# Patient Record
Sex: Male | Born: 1950 | Race: White | Hispanic: No | Marital: Married | Smoking: Former smoker
Health system: Southern US, Community
[De-identification: ages and names within clinical notes are randomized; demographics above are authoritative.]

## PROBLEM LIST (undated history)

## (undated) DIAGNOSIS — R200 Anesthesia of skin: Secondary | ICD-10-CM

## (undated) DIAGNOSIS — M1990 Unspecified osteoarthritis, unspecified site: Secondary | ICD-10-CM

## (undated) DIAGNOSIS — K219 Gastro-esophageal reflux disease without esophagitis: Secondary | ICD-10-CM

## (undated) DIAGNOSIS — R112 Nausea with vomiting, unspecified: Secondary | ICD-10-CM

## (undated) DIAGNOSIS — I10 Essential (primary) hypertension: Secondary | ICD-10-CM

## (undated) DIAGNOSIS — Z9889 Other specified postprocedural states: Secondary | ICD-10-CM

## (undated) HISTORY — PX: LYMPH GLAND EXCISION: SHX13

## (undated) HISTORY — PX: EYE SURGERY: SHX253

## (undated) HISTORY — PX: VASECTOMY: SHX75

## (undated) HISTORY — PX: BACK SURGERY: SHX140

## (undated) HISTORY — PX: FRACTURE SURGERY: SHX138

## (undated) HISTORY — PX: OTHER SURGICAL HISTORY: SHX169

---

## 2014-04-25 ENCOUNTER — Other Ambulatory Visit: Payer: Self-pay | Admitting: Neurosurgery

## 2014-07-30 ENCOUNTER — Other Ambulatory Visit: Payer: Self-pay | Admitting: Neurosurgery

## 2014-07-30 NOTE — Pre-Procedure Instructions (Signed)
Jonathon Gonzales  07/30/2014   Your procedure is scheduled on:  Tuesday, August 07, 2014 at 7:30 AM.   Report to [facility] Entrance "A" Admitting Office at 5:30 AM.   Call this number if you have problems the morning of surgery: [phone]                Any questions prior to day of surgery, please call [phone] between the hours of 8 AM and 4 PM.   Remember:   Do not eat food or drink liquids after midnight.   Take these medicines the morning of surgery with A SIP OF WATER: Prilosec, Tylenol - if needed  Stop Mobic and Vitamins as of Wednesday, 08/01/14.   Do not wear jewelry.  Do not wear lotions, powders, or cologne. You may wear deodorant.  Men may shave face and neck.  Do not bring valuables to the hospital.  [facility] is not responsible                  for any belongings or valuables.               Contacts, dentures or bridgework may not be worn into surgery.  Leave suitcase in the car. After surgery it may be brought to your room.  For patients admitted to the hospital, discharge time is determined by your                treatment team.              Special Instructions: See "Preparing for Surgery"   Please read over the following fact sheets that you were given: Pain Booklet, Coughing and Deep Breathing, MRSA Information and Surgical Site Infection Prevention

## 2014-07-30 NOTE — Progress Notes (Signed)
No pre-op orders in EPIC. Called Dr. Melven Sartorius office and spoke with Janett Billow requesting orders.

## 2014-07-31 ENCOUNTER — Encounter (HOSPITAL_COMMUNITY)
Admission: RE | Admit: 2014-07-31 | Discharge: 2014-07-31 | Disposition: A | Discharge status: Home / Self Care | Payer: BC Managed Care – PPO | Source: Ambulatory Visit | Attending: Neurosurgery | Admitting: Neurosurgery

## 2014-07-31 ENCOUNTER — Encounter (HOSPITAL_COMMUNITY): Payer: Self-pay

## 2014-07-31 DIAGNOSIS — Z01812 Encounter for preprocedural laboratory examination: Secondary | ICD-10-CM | POA: Diagnosis present

## 2014-07-31 HISTORY — DX: Unspecified osteoarthritis, unspecified site: M19.90

## 2014-07-31 LAB — CBC
HCT: 46.4 % (ref 39.0–52.0)
Hemoglobin: 15.5 g/dL (ref 13.0–17.0)
MCH: 30.2 pg (ref 26.0–34.0)
MCHC: 33.4 g/dL (ref 30.0–36.0)
MCV: 90.4 fL (ref 78.0–100.0)
PLATELETS: 152 10*3/uL (ref 150–400)
RBC: 5.13 MIL/uL (ref 4.22–5.81)
RDW: 12.7 % (ref 11.5–15.5)
WBC: 5 10*3/uL (ref 4.0–10.5)

## 2014-07-31 LAB — SURGICAL PCR SCREEN
MRSA, PCR: NEGATIVE
STAPHYLOCOCCUS AUREUS: NEGATIVE

## 2014-07-31 LAB — BASIC METABOLIC PANEL
ANION GAP: 11 (ref 5–15)
BUN: 19 mg/dL (ref 6–23)
CALCIUM: 9.4 mg/dL (ref 8.4–10.5)
CO2: 25 mEq/L (ref 19–32)
Chloride: 104 mEq/L (ref 96–112)
Creatinine, Ser: 0.91 mg/dL (ref 0.50–1.35)
GFR calc Af Amer: >90 mL/min (ref >90)
GFR, EST NON AFRICAN AMERICAN: 88 mL/min — AB (ref >90)
Glucose, Bld: 93 mg/dL (ref 70–99)
POTASSIUM: 4.9 meq/L (ref 3.7–5.3)
Sodium: 140 mEq/L (ref 137–147)

## 2014-08-06 MED ORDER — CEFAZOLIN SODIUM-DEXTROSE 2-3 GM-% IV SOLR
2.0000 g | INTRAVENOUS | Status: AC
  Administered 2014-08-07: 2 g via INTRAVENOUS
  Filled 2014-08-06: qty 50

## 2014-08-06 NOTE — Anesthesia Preprocedure Evaluation (Addendum)
Anesthesia Evaluation  Patient identified by MRN, date of birth, ID band Patient awake    Reviewed: Allergy & Precautions, H&P , NPO status , Patient's Chart, lab work & pertinent test results, reviewed documented beta blocker date and time   Airway Mallampati: II   Neck ROM: Full    Dental  (+) Teeth Intact, Dental Advisory Given, Partial Upper   Pulmonary former smoker,  breath sounds clear to auscultation        Cardiovascular Rhythm:Regular     Neuro/Psych    GI/Hepatic   Endo/Other    Renal/GU      Musculoskeletal   Abdominal (+)  Abdomen: soft.    Peds  Hematology   Anesthesia Other Findings   Reproductive/Obstetrics                           Anesthesia Physical Anesthesia Plan  ASA: II  Anesthesia Plan: General   Post-op Pain Management:    Induction: Intravenous  Airway Management Planned: Oral ETT  Additional Equipment:   Intra-op Plan:   Post-operative Plan:   Informed Consent: I have reviewed the patients History and Physical, chart, labs and discussed the procedure including the risks, benefits and alternatives for the proposed anesthesia with the patient or authorized representative who has indicated his/her understanding and acceptance.   Dental advisory given  Plan Discussed with: CRNA, Anesthesiologist and Surgeon  Anesthesia Plan Comments:        Anesthesia Quick Evaluation

## 2014-08-07 ENCOUNTER — Encounter (HOSPITAL_COMMUNITY)
Admission: RE | Disposition: A | Discharge status: Home / Self Care | Payer: Self-pay | Source: Ambulatory Visit | Attending: Neurosurgery

## 2014-08-07 ENCOUNTER — Inpatient Hospital Stay (HOSPITAL_COMMUNITY): Payer: BC Managed Care – PPO

## 2014-08-07 ENCOUNTER — Inpatient Hospital Stay (HOSPITAL_COMMUNITY)
Admission: RE | Admit: 2014-08-07 | Discharge: 2014-08-09 | DRG: 459 | Disposition: A | Discharge status: Home / Self Care | Payer: BC Managed Care – PPO | Source: Ambulatory Visit | Attending: Neurosurgery | Admitting: Neurosurgery

## 2014-08-07 ENCOUNTER — Inpatient Hospital Stay (HOSPITAL_COMMUNITY): Payer: BC Managed Care – PPO | Admitting: Anesthesiology

## 2014-08-07 ENCOUNTER — Encounter (HOSPITAL_COMMUNITY): Payer: Self-pay | Admitting: Anesthesiology

## 2014-08-07 DIAGNOSIS — M5386 Other specified dorsopathies, lumbar region: Secondary | ICD-10-CM | POA: Diagnosis present

## 2014-08-07 DIAGNOSIS — M5116 Intervertebral disc disorders with radiculopathy, lumbar region: Secondary | ICD-10-CM | POA: Diagnosis present

## 2014-08-07 DIAGNOSIS — M25551 Pain in right hip: Secondary | ICD-10-CM | POA: Diagnosis present

## 2014-08-07 DIAGNOSIS — G5600 Carpal tunnel syndrome, unspecified upper limb: Secondary | ICD-10-CM | POA: Diagnosis present

## 2014-08-07 DIAGNOSIS — M1990 Unspecified osteoarthritis, unspecified site: Secondary | ICD-10-CM | POA: Diagnosis present

## 2014-08-07 DIAGNOSIS — M4316 Spondylolisthesis, lumbar region: Principal | ICD-10-CM | POA: Diagnosis present

## 2014-08-07 DIAGNOSIS — M4806 Spinal stenosis, lumbar region: Secondary | ICD-10-CM | POA: Diagnosis present

## 2014-08-07 DIAGNOSIS — M7130 Other bursal cyst, unspecified site: Secondary | ICD-10-CM | POA: Diagnosis present

## 2014-08-07 DIAGNOSIS — G9519 Other vascular myelopathies: Secondary | ICD-10-CM | POA: Diagnosis present

## 2014-08-07 DIAGNOSIS — Z419 Encounter for procedure for purposes other than remedying health state, unspecified: Secondary | ICD-10-CM

## 2014-08-07 DIAGNOSIS — M7138 Other bursal cyst, other site: Secondary | ICD-10-CM | POA: Diagnosis present

## 2014-08-07 DIAGNOSIS — M549 Dorsalgia, unspecified: Secondary | ICD-10-CM | POA: Diagnosis present

## 2014-08-07 HISTORY — PX: MAXIMUM ACCESS (MAS)POSTERIOR LUMBAR INTERBODY FUSION (PLIF) 1 LEVEL: SHX6368

## 2014-08-07 SURGERY — FOR MAXIMUM ACCESS (MAS) POSTERIOR LUMBAR INTERBODY FUSION (PLIF) 1 LEVEL
Anesthesia: General | Site: Spine Lumbar

## 2014-08-07 MED ORDER — METHOCARBAMOL 500 MG PO TABS
500.0000 mg | ORAL_TABLET | Freq: Four times a day (QID) | ORAL | Status: DC | PRN
  Administered 2014-08-07 – 2014-08-09 (×6): 500 mg via ORAL
  Filled 2014-08-07 (×6): qty 1

## 2014-08-07 MED ORDER — PHENYLEPHRINE HCL 10 MG/ML IJ SOLN
INTRAMUSCULAR | Status: DC | PRN
  Administered 2014-08-07: 40 ug via INTRAVENOUS
  Administered 2014-08-07 (×2): 80 ug via INTRAVENOUS

## 2014-08-07 MED ORDER — METHOCARBAMOL 500 MG PO TABS
ORAL_TABLET | ORAL | Status: AC
  Filled 2014-08-07: qty 1

## 2014-08-07 MED ORDER — FENTANYL CITRATE 0.05 MG/ML IJ SOLN
INTRAMUSCULAR | Status: AC
  Filled 2014-08-07: qty 2

## 2014-08-07 MED ORDER — PROPOFOL INFUSION 10 MG/ML OPTIME
INTRAVENOUS | Status: DC | PRN
  Administered 2014-08-07: 50 ug/kg/min via INTRAVENOUS

## 2014-08-07 MED ORDER — MORPHINE SULFATE 2 MG/ML IJ SOLN
1.0000 mg | INTRAMUSCULAR | Status: DC | PRN
  Administered 2014-08-07 (×2): 2 mg via INTRAVENOUS
  Filled 2014-08-07 (×2): qty 1

## 2014-08-07 MED ORDER — FLEET ENEMA 7-19 GM/118ML RE ENEM
1.0000 | ENEMA | Freq: Once | RECTAL | Status: AC | PRN
Start: 2014-08-07 — End: 2014-08-07
  Filled 2014-08-07: qty 1

## 2014-08-07 MED ORDER — PROPOFOL 10 MG/ML IV EMUL
INTRAVENOUS | Status: AC
  Filled 2014-08-07: qty 150

## 2014-08-07 MED ORDER — MEPERIDINE HCL 25 MG/ML IJ SOLN: 6.2500 mg | INTRAMUSCULAR | Status: DC | PRN

## 2014-08-07 MED ORDER — CEFAZOLIN SODIUM 1-5 GM-% IV SOLN
1.0000 g | Freq: Three times a day (TID) | INTRAVENOUS | Status: AC
  Administered 2014-08-07 (×2): 1 g via INTRAVENOUS
  Filled 2014-08-07 (×2): qty 50

## 2014-08-07 MED ORDER — METHOCARBAMOL 1000 MG/10ML IJ SOLN
500.0000 mg | Freq: Four times a day (QID) | INTRAVENOUS | Status: DC | PRN
  Filled 2014-08-07: qty 5

## 2014-08-07 MED ORDER — SODIUM CHLORIDE 0.9 % IJ SOLN: 3.0000 mL | INTRAMUSCULAR | Status: DC | PRN

## 2014-08-07 MED ORDER — FENTANYL CITRATE 0.05 MG/ML IJ SOLN
INTRAMUSCULAR | Status: AC
  Filled 2014-08-07: qty 5

## 2014-08-07 MED ORDER — LACTATED RINGERS IV SOLN
INTRAVENOUS | Status: DC | PRN
  Administered 2014-08-07 (×3): via INTRAVENOUS

## 2014-08-07 MED ORDER — PROPOFOL 10 MG/ML IV EMUL
INTRAVENOUS | Status: AC
  Filled 2014-08-07: qty 100

## 2014-08-07 MED ORDER — ONDANSETRON HCL 4 MG/2ML IJ SOLN
INTRAMUSCULAR | Status: DC | PRN
  Administered 2014-08-07: 4 mg via INTRAVENOUS

## 2014-08-07 MED ORDER — DOCUSATE SODIUM 100 MG PO CAPS
100.0000 mg | ORAL_CAPSULE | Freq: Two times a day (BID) | ORAL | Status: DC
  Administered 2014-08-07 – 2014-08-08 (×3): 100 mg via ORAL
  Filled 2014-08-07 (×5): qty 1

## 2014-08-07 MED ORDER — ONDANSETRON HCL 4 MG/2ML IJ SOLN
INTRAMUSCULAR | Status: AC
  Filled 2014-08-07: qty 2

## 2014-08-07 MED ORDER — DEXAMETHASONE SODIUM PHOSPHATE 4 MG/ML IJ SOLN
INTRAMUSCULAR | Status: DC | PRN
  Administered 2014-08-07: 4 mg via INTRAVENOUS

## 2014-08-07 MED ORDER — SUCCINYLCHOLINE CHLORIDE 20 MG/ML IJ SOLN
INTRAMUSCULAR | Status: AC
  Filled 2014-08-07: qty 1

## 2014-08-07 MED ORDER — KCL IN DEXTROSE-NACL 20-5-0.45 MEQ/L-%-% IV SOLN
INTRAVENOUS | Status: DC
  Filled 2014-08-07 (×5): qty 1000

## 2014-08-07 MED ORDER — PROPOFOL 10 MG/ML IV BOLUS
INTRAVENOUS | Status: DC | PRN
  Administered 2014-08-07: 150 mg via INTRAVENOUS

## 2014-08-07 MED ORDER — THROMBIN 20000 UNITS EX SOLR
CUTANEOUS | Status: DC | PRN
  Administered 2014-08-07: 09:00:00 via TOPICAL

## 2014-08-07 MED ORDER — BUPIVACAINE LIPOSOME 1.3 % IJ SUSP
20.0000 mL | Freq: Once | INTRAMUSCULAR | Status: DC
  Filled 2014-08-07: qty 20

## 2014-08-07 MED ORDER — HYDROCODONE-ACETAMINOPHEN 5-325 MG PO TABS: 1.0000 | ORAL_TABLET | ORAL | Status: DC | PRN

## 2014-08-07 MED ORDER — 0.9 % SODIUM CHLORIDE (POUR BTL) OPTIME
TOPICAL | Status: DC | PRN
  Administered 2014-08-07: 1000 mL

## 2014-08-07 MED ORDER — SENNOSIDES-DOCUSATE SODIUM 8.6-50 MG PO TABS
1.0000 | ORAL_TABLET | Freq: Every evening | ORAL | Status: DC | PRN
  Filled 2014-08-07: qty 1

## 2014-08-07 MED ORDER — MIDAZOLAM HCL 5 MG/5ML IJ SOLN
INTRAMUSCULAR | Status: DC | PRN
  Administered 2014-08-07: 1 mg via INTRAVENOUS

## 2014-08-07 MED ORDER — ALUM & MAG HYDROXIDE-SIMETH 200-200-20 MG/5ML PO SUSP
30.0000 mL | Freq: Four times a day (QID) | ORAL | Status: DC | PRN
  Administered 2014-08-07: 30 mL via ORAL
  Filled 2014-08-07: qty 30

## 2014-08-07 MED ORDER — PANTOPRAZOLE SODIUM 40 MG PO TBEC
40.0000 mg | DELAYED_RELEASE_TABLET | Freq: Every day | ORAL | Status: DC
  Administered 2014-08-08: 40 mg via ORAL
  Filled 2014-08-07: qty 1

## 2014-08-07 MED ORDER — MIDAZOLAM HCL 2 MG/2ML IJ SOLN
INTRAMUSCULAR | Status: AC
  Filled 2014-08-07: qty 2

## 2014-08-07 MED ORDER — ROCURONIUM BROMIDE 50 MG/5ML IV SOLN
INTRAVENOUS | Status: AC
  Filled 2014-08-07: qty 1

## 2014-08-07 MED ORDER — EPHEDRINE SULFATE 50 MG/ML IJ SOLN
INTRAMUSCULAR | Status: AC
  Filled 2014-08-07: qty 1

## 2014-08-07 MED ORDER — SODIUM CHLORIDE 0.9 % IJ SOLN
3.0000 mL | Freq: Two times a day (BID) | INTRAMUSCULAR | Status: DC
  Administered 2014-08-07 – 2014-08-08 (×4): 3 mL via INTRAVENOUS

## 2014-08-07 MED ORDER — ZOLPIDEM TARTRATE 5 MG PO TABS: 5.0000 mg | ORAL_TABLET | Freq: Every evening | ORAL | Status: DC | PRN

## 2014-08-07 MED ORDER — FENTANYL CITRATE 0.05 MG/ML IJ SOLN
INTRAMUSCULAR | Status: DC | PRN
  Administered 2014-08-07: 150 ug via INTRAVENOUS
  Administered 2014-08-07: 50 ug via INTRAVENOUS
  Administered 2014-08-07 (×2): 25 ug via INTRAVENOUS
  Administered 2014-08-07 (×2): 50 ug via INTRAVENOUS

## 2014-08-07 MED ORDER — BUPIVACAINE HCL (PF) 0.5 % IJ SOLN
INTRAMUSCULAR | Status: DC | PRN
  Administered 2014-08-07: 5 mL

## 2014-08-07 MED ORDER — OXYCODONE-ACETAMINOPHEN 5-325 MG PO TABS
1.0000 | ORAL_TABLET | ORAL | Status: DC | PRN
  Administered 2014-08-07 – 2014-08-09 (×7): 2 via ORAL
  Filled 2014-08-07 (×7): qty 2

## 2014-08-07 MED ORDER — MENTHOL 3 MG MT LOZG: 1.0000 | LOZENGE | OROMUCOSAL | Status: DC | PRN

## 2014-08-07 MED ORDER — ADULT MULTIVITAMIN W/MINERALS CH
1.0000 | ORAL_TABLET | Freq: Every day | ORAL | Status: DC
  Administered 2014-08-08: 1 via ORAL
  Filled 2014-08-07 (×3): qty 1

## 2014-08-07 MED ORDER — NEOSTIGMINE METHYLSULFATE 10 MG/10ML IV SOLN
INTRAVENOUS | Status: AC
  Filled 2014-08-07: qty 1

## 2014-08-07 MED ORDER — ACETAMINOPHEN 325 MG PO TABS: 650.0000 mg | ORAL_TABLET | ORAL | Status: DC | PRN

## 2014-08-07 MED ORDER — ACETAMINOPHEN 650 MG RE SUPP: 650.0000 mg | RECTAL | Status: DC | PRN

## 2014-08-07 MED ORDER — PHENOL 1.4 % MT LIQD: 1.0000 | OROMUCOSAL | Status: DC | PRN

## 2014-08-07 MED ORDER — OXYCODONE HCL 5 MG PO TABS
ORAL_TABLET | ORAL | Status: AC
  Filled 2014-08-07: qty 1

## 2014-08-07 MED ORDER — PHENYLEPHRINE 40 MCG/ML (10ML) SYRINGE FOR IV PUSH (FOR BLOOD PRESSURE SUPPORT)
PREFILLED_SYRINGE | INTRAVENOUS | Status: AC
  Filled 2014-08-07: qty 10

## 2014-08-07 MED ORDER — GLYCOPYRROLATE 0.2 MG/ML IJ SOLN
INTRAMUSCULAR | Status: AC
  Filled 2014-08-07: qty 2

## 2014-08-07 MED ORDER — ARTIFICIAL TEARS OP OINT
TOPICAL_OINTMENT | OPHTHALMIC | Status: AC
  Filled 2014-08-07: qty 3.5

## 2014-08-07 MED ORDER — ACETAMINOPHEN 500 MG PO TABS: 1000.0000 mg | ORAL_TABLET | Freq: Two times a day (BID) | ORAL | Status: DC | PRN

## 2014-08-07 MED ORDER — SENNA 8.6 MG PO TABS
1.0000 | ORAL_TABLET | Freq: Two times a day (BID) | ORAL | Status: DC
  Administered 2014-08-07 – 2014-08-08 (×3): 8.6 mg via ORAL
  Filled 2014-08-07 (×5): qty 1

## 2014-08-07 MED ORDER — BISACODYL 10 MG RE SUPP: 10.0000 mg | Freq: Every day | RECTAL | Status: DC | PRN

## 2014-08-07 MED ORDER — PROPOFOL 10 MG/ML IV BOLUS
INTRAVENOUS | Status: AC
  Filled 2014-08-07: qty 20

## 2014-08-07 MED ORDER — LIDOCAINE-EPINEPHRINE 1 %-1:100000 IJ SOLN
INTRAMUSCULAR | Status: DC | PRN
  Administered 2014-08-07: 5 mL

## 2014-08-07 MED ORDER — PANTOPRAZOLE SODIUM 40 MG IV SOLR: 40.0000 mg | Freq: Every day | INTRAVENOUS | Status: DC

## 2014-08-07 MED ORDER — SUCCINYLCHOLINE CHLORIDE 20 MG/ML IJ SOLN
INTRAMUSCULAR | Status: DC | PRN
  Administered 2014-08-07: 100 mg via INTRAVENOUS

## 2014-08-07 MED ORDER — PROMETHAZINE HCL 25 MG/ML IJ SOLN: 6.2500 mg | INTRAMUSCULAR | Status: DC | PRN

## 2014-08-07 MED ORDER — BUPIVACAINE LIPOSOME 1.3 % IJ SUSP
INTRAMUSCULAR | Status: DC | PRN
  Administered 2014-08-07: 20 mL

## 2014-08-07 MED ORDER — ONDANSETRON HCL 4 MG/2ML IJ SOLN
4.0000 mg | INTRAMUSCULAR | Status: DC | PRN
Start: 2014-08-07 — End: 2014-08-09
  Administered 2014-08-07: 4 mg via INTRAVENOUS
  Filled 2014-08-07: qty 2

## 2014-08-07 MED ORDER — LIDOCAINE HCL (CARDIAC) 20 MG/ML IV SOLN
INTRAVENOUS | Status: DC | PRN
  Administered 2014-08-07: 50 mg via INTRAVENOUS

## 2014-08-07 MED ORDER — FENTANYL CITRATE 0.05 MG/ML IJ SOLN
25.0000 ug | INTRAMUSCULAR | Status: DC | PRN
  Administered 2014-08-07: 50 ug via INTRAVENOUS
  Administered 2014-08-07 (×2): 25 ug via INTRAVENOUS
  Administered 2014-08-07: 50 ug via INTRAVENOUS

## 2014-08-07 MED ORDER — MIDAZOLAM HCL 2 MG/2ML IJ SOLN
INTRAMUSCULAR | Status: AC
  Administered 2014-08-07: 0.5 mg
  Filled 2014-08-07: qty 2

## 2014-08-07 MED ORDER — ALBUMIN HUMAN 5 % IV SOLN
INTRAVENOUS | Status: DC | PRN
  Administered 2014-08-07: 10:00:00 via INTRAVENOUS

## 2014-08-07 SURGICAL SUPPLY — 83 items
BENZOIN TINCTURE PRP APPL 2/3 (GAUZE/BANDAGES/DRESSINGS) IMPLANT
BLADE CLIPPER SURG (BLADE) IMPLANT
BONE MATRIX OSTEOCEL PRO MED (Bone Implant) ×3 IMPLANT
BUR MATCHSTICK NEURO 3.0 LAGG (BURR) ×3 IMPLANT
BUR ROUND FLUTED 5 RND (BURR) ×4 IMPLANT
BUR ROUND FLUTED 5MM RND (BURR) ×2
CAGE PLIF 8X9X23-12 LUMBAR (Cage) ×6 IMPLANT
CANISTER SUCT 3000ML (MISCELLANEOUS) ×3 IMPLANT
CLIP NEUROVISION LG (CLIP) ×3 IMPLANT
CLOSURE WOUND 1/2 X4 (GAUZE/BANDAGES/DRESSINGS)
CONT SPEC 4OZ CLIKSEAL STRL BL (MISCELLANEOUS) ×6 IMPLANT
COVER BACK TABLE 24X17X13 BIG (DRAPES) ×3 IMPLANT
COVER BACK TABLE 60X90IN (DRAPES) ×3 IMPLANT
DECANTER SPIKE VIAL GLASS SM (MISCELLANEOUS) ×3 IMPLANT
DRAPE C-ARM 42X72 X-RAY (DRAPES) ×3 IMPLANT
DRAPE C-ARMOR (DRAPES) ×3 IMPLANT
DRAPE LAPAROTOMY 100X72X124 (DRAPES) ×3 IMPLANT
DRAPE POUCH INSTRU U-SHP 10X18 (DRAPES) ×3 IMPLANT
DRAPE SURG 17X23 STRL (DRAPES) ×3 IMPLANT
DRSG OPSITE POSTOP 4X6 (GAUZE/BANDAGES/DRESSINGS) ×3 IMPLANT
DRSG TELFA 3X8 NADH (GAUZE/BANDAGES/DRESSINGS) IMPLANT
DURAPREP 26ML APPLICATOR (WOUND CARE) ×3 IMPLANT
ELECT BLADE 4.0 EZ CLEAN MEGAD (MISCELLANEOUS) ×3
ELECT REM PT RETURN 9FT ADLT (ELECTROSURGICAL) ×3
ELECTRODE BLDE 4.0 EZ CLN MEGD (MISCELLANEOUS) ×1 IMPLANT
ELECTRODE REM PT RTRN 9FT ADLT (ELECTROSURGICAL) ×1 IMPLANT
EVACUATOR 1/8 PVC DRAIN (DRAIN) IMPLANT
GAUZE SPONGE 4X4 12PLY STRL (GAUZE/BANDAGES/DRESSINGS) IMPLANT
GAUZE SPONGE 4X4 16PLY XRAY LF (GAUZE/BANDAGES/DRESSINGS) IMPLANT
GLOVE BIO SURGEON STRL SZ8 (GLOVE) ×6 IMPLANT
GLOVE BIOGEL PI IND STRL 7.0 (GLOVE) ×1 IMPLANT
GLOVE BIOGEL PI IND STRL 8 (GLOVE) ×2 IMPLANT
GLOVE BIOGEL PI IND STRL 8.5 (GLOVE) ×2 IMPLANT
GLOVE BIOGEL PI INDICATOR 7.0 (GLOVE) ×2
GLOVE BIOGEL PI INDICATOR 8 (GLOVE) ×4
GLOVE BIOGEL PI INDICATOR 8.5 (GLOVE) ×4
GLOVE ECLIPSE 7.5 STRL STRAW (GLOVE) ×9 IMPLANT
GLOVE ECLIPSE 8.0 STRL XLNG CF (GLOVE) ×6 IMPLANT
GLOVE EXAM NITRILE LRG STRL (GLOVE) IMPLANT
GLOVE EXAM NITRILE MD LF STRL (GLOVE) IMPLANT
GLOVE EXAM NITRILE XL STR (GLOVE) IMPLANT
GLOVE EXAM NITRILE XS STR PU (GLOVE) IMPLANT
GOWN STRL REUS W/ TWL LRG LVL3 (GOWN DISPOSABLE) IMPLANT
GOWN STRL REUS W/ TWL XL LVL3 (GOWN DISPOSABLE) ×2 IMPLANT
GOWN STRL REUS W/TWL 2XL LVL3 (GOWN DISPOSABLE) ×9 IMPLANT
GOWN STRL REUS W/TWL LRG LVL3 (GOWN DISPOSABLE)
GOWN STRL REUS W/TWL XL LVL3 (GOWN DISPOSABLE) ×4
KIT BASIN OR (CUSTOM PROCEDURE TRAY) ×3 IMPLANT
KIT NEEDLE NVM5 EMG ELECT (KITS) ×1 IMPLANT
KIT NEEDLE NVM5 EMG ELECTRODE (KITS) ×2
KIT POSITION SURG JACKSON T1 (MISCELLANEOUS) ×3 IMPLANT
KIT ROOM TURNOVER OR (KITS) ×3 IMPLANT
LIQUID BAND (GAUZE/BANDAGES/DRESSINGS) ×3 IMPLANT
MILL MEDIUM DISP (BLADE) ×3 IMPLANT
NEEDLE HYPO 25X1 1.5 SAFETY (NEEDLE) ×3 IMPLANT
NEEDLE SPNL 18GX3.5 QUINCKE PK (NEEDLE) IMPLANT
NS IRRIG 1000ML POUR BTL (IV SOLUTION) ×3 IMPLANT
PACK LAMINECTOMY NEURO (CUSTOM PROCEDURE TRAY) ×3 IMPLANT
PAD ARMBOARD 7.5X6 YLW CONV (MISCELLANEOUS) ×9 IMPLANT
PATTIES SURGICAL .5 X.5 (GAUZE/BANDAGES/DRESSINGS) IMPLANT
PATTIES SURGICAL .5 X1 (DISPOSABLE) IMPLANT
PATTIES SURGICAL 1X1 (DISPOSABLE) IMPLANT
ROD 35MM (Rod) ×6 IMPLANT
SCREW LOCK (Screw) ×8 IMPLANT
SCREW LOCK FXNS SPNE MAS PL (Screw) ×4 IMPLANT
SCREW PLIF MAS 5.5X35 LUMBAR (Screw) ×6 IMPLANT
SCREW SHANKS 5.5X35 (Screw) ×6 IMPLANT
SCREW TULIP 5.5 (Screw) ×6 IMPLANT
SPONGE LAP 4X18 X RAY DECT (DISPOSABLE) IMPLANT
SPONGE SURGIFOAM ABS GEL 100 (HEMOSTASIS) ×3 IMPLANT
STAPLER SKIN PROX WIDE 3.9 (STAPLE) IMPLANT
STRIP CLOSURE SKIN 1/2X4 (GAUZE/BANDAGES/DRESSINGS) IMPLANT
SUT VIC AB 1 CT1 18XBRD ANBCTR (SUTURE) ×2 IMPLANT
SUT VIC AB 1 CT1 8-18 (SUTURE) ×4
SUT VIC AB 2-0 CT1 18 (SUTURE) ×6 IMPLANT
SUT VIC AB 3-0 SH 8-18 (SUTURE) ×3 IMPLANT
SYR 20ML ECCENTRIC (SYRINGE) ×3 IMPLANT
SYR 5ML LL (SYRINGE) IMPLANT
TOWEL OR 17X24 6PK STRL BLUE (TOWEL DISPOSABLE) ×3 IMPLANT
TOWEL OR 17X26 10 PK STRL BLUE (TOWEL DISPOSABLE) ×3 IMPLANT
TRAP SPECIMEN MUCOUS 40CC (MISCELLANEOUS) ×3 IMPLANT
TRAY FOLEY CATH 14FRSI W/METER (CATHETERS) IMPLANT
WATER STERILE IRR 1000ML POUR (IV SOLUTION) ×3 IMPLANT

## 2014-08-07 NOTE — H&P (Signed)
[place], [place] [address] Phone: [phone]   Patient ID:   [id number] Patient: Jonathon Gonzales  Date of Birth: Aug 23, 1951 Visit Type: Office Visit   Date: 04/25/2014 11:15 AM Provider: Marchia Meiers. Vertell Limber MD   This 63 year old male presents for Follow Up of back pain.  History of Present Illness: 1.  Follow Up of back pain  Patient is significant spondylolisthesis of L4 and L5 10 mm a neutral lateral radiograph increasing to 12 mm in flexion and decreasing to 9.4 mm on extension.  He has a significant synovial cyst and nerve root compression at this level.  He is scheduled for cataract surgery in September.  He says he cannot continue to live with her degree of pain he is experiencing and wishes to go ahead with surgery.  He is also concerned that he has having some difficulty with erections.  Dr. Luiz Ochoa performed L4-L5 laminectomy in December 2013.  He needs to wait until early December because of his work schedule and wants to go ahead and do so.  He was fitted for an LSO brace today and we answered his questions.  Nurse teaching was performed.      Medical/Surgical/Interim History Reviewed, no change.  Last detailed document date:03/30/2013.   PAST MEDICAL HISTORY, SURGICAL HISTORY, FAMILY HISTORY, SOCIAL HISTORY AND REVIEW OF SYSTEMS I have reviewed the patient's past medical, surgical, family and social history as well as the comprehensive review of systems as included on the [facility] history form dated 08/02/2013, which I have signed.  Family History: Reviewed, no changes.  Last detailed document: 03/30/2013.   Social History: Tobacco use reviewed. Reviewed, no changes. Last detailed document date: 03/30/2013.      MEDICATIONS(added, continued or stopped this visit):   Started Medication Directions Instruction Stopped  08/02/2013 meloxicam 7.5 mg tablet take 1 tablet by oral route 2 times every day as  needed     Prilosec OTC 20 mg tablet,delayed release take 1 tablet by mouth daily     Tylenol Extra Strength 500 mg tablet take 2 tablet by oral route 2 times every day      ALLERGIES:  Ingredient Reaction Medication Name Comment  NO KNOWN ALLERGIES     No known allergies. Reviewed, no changes.   Vitals Date Temp F BP Pulse Ht In Wt Lb BMI BSA Pain Score  04/25/2014  151/73 62 74 254 32.61  6/10      DIAGNOSTIC RESULTS Diagnostic report text  CLINICAL DATA: Low back and left buttock and leg pain with numbness and tingling in both feet. Left leg weakness.  EXAM: MRI LUMBAR SPINE WITHOUT AND WITH CONTRAST  TECHNIQUE: Multiplanar and multiecho pulse sequences of the lumbar spine were obtained without and with intravenous contrast.  CONTRAST: 20 cc MultiHance  COMPARISON: Radiographs dated 02/21/2014 and MRI dated 12/12/2012  FINDINGS: Normal conus tip at L1-2. Paraspinal soft tissues are normal except postsurgical changes.  L1-2: 4 mm retrolisthesis with a small broad-based bulge of the uncovered disc with no neural impingement, unchanged.  L2-3: 4 mm retrolisthesis with a broad-based bulge of the uncovered disc extending far laterally to the right and slightly compressing the thecal sac symmetrically, unchanged.  L3-4: Small right far lateral disc protrusion with a slight mass effect upon the right L3 nerve lateral to the neural foramen. Broad-based bulge of the rest of the discs. Slight compression of the thecal sac without focal neural impingement, unchanged.  L4-5: New  4.5 mm spondylolisthesis with increased protrusion of the uncovered disc central and asymmetric to the left and extending into the left neural foramen. The left L4 nerve is compressed under the pedicle. There is also a moderate right foraminal stenosis. Severe bilateral facet arthritis has progressed with increased hypertrophy of the ligamentum flavum and increased bilateral joint  effusions. There is new compression of the thecal sac by the hypertrophied ligamentum flavum.  There is a new 15 x 15 x 11 mm synovial cyst arising from the inferior aspect of the left facet joint markedly compressing the left side of the thecal sac and extending into the left lateral recess. This has a mass effect upon the left L5 and left S1 and S2 nerves as well as a mass effect upon the right S2 and S3 in more distal nerves.  L5-S1: Normal disc. Progressive slight bilateral facet arthritis.  L5-S1:  IMPRESSION: 1. Interval development of a large synovial cyst arising from the inferior aspect of the left facet joint at L4-5 behind the body of L5 markedly compressing the thecal sac as described above. 2. New spondylolisthesis at L4-5 with progressive disc protrusion central and to the left at L4-5 extending into the left neural foramen.   Electronically Signed By: Rozetta Nunnery M.D. On: 03/08/2014 11:01    IMPRESSION Patient is mobile spondylolisthesis and significant spinal stenosis with large synovial cyst at the L4 L5 level.  He wishes to go ahead with surgery.  This will consist of redo decompression and fusion at the L4 L5 level.  Completed Orders (this encounter) Order Details Reason Side Interpretation Result Initial Treatment Date Region  Hypertension education Continue to monitor blood pressure. If remains elevated, contact primary care physician.         Assessment/Plan # Detail Type Description   1. 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       2. Assessment Acquired spondylolisthesis (738.4).       3. 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       4. Assessment Herniated lumbar intervertebral disc (722.10).       5. Assessment Lumbar radiculopathy (724.4).       6. Assessment Lumbago (724.2).       7. Assessment Abnormal findings, elevated BP w/o HTN (796.2).         Pain  Assessment/Treatment Pain Scale: 6/10. Method: Numeric Pain Intensity Scale. Location: back. Onset: 11/21/2013. Duration: varies. Quality: discomforting. Pain Assessment/Treatment follow-up plan of care: Patient is currently taking medication for pain as prescribed..  She was fitted for LSO brace today and risks and benefits of surgery were discussed in detail.  He wishes to proceed.  He wants to hold off until December because of his work schedule  Orders: Instruction(s)/Education: Assessment Instruction  796.2 Hypertension education             Provider:  Marchia Meiers. Vertell Limber MD  04/28/2014 05:40 PM Dictation edited by: Marchia Meiers. Vertell Limber    CC Providers: Lovette Cliche II [facility] [address] [place],  [place]  [address]-   Kalum Hirsch MD [address] [place], [place] [address] ----------------------------------------------------------------------------------------------------------------------------------------------------------------------         Electronically signed by Marchia Meiers. Vertell Limber MD on 04/28/2014 05:40 PM  > [address] [place], [place] [address] Phone: [phone]   Patient ID:   [id number] Patient: Jonathon Gonzales  Date of Birth: [birth year] Visit Type: Office Visit   Date: 02/21/2014 09:15  AM Provider: Marchia Meiers. Vertell Limber MD   This 63 year old male presents for Follow Up of back pain.  History of Present Illness: 1.  Follow Up of back pain  Jeneen Rinks Mcminn visits, having seen Dr. Luiz Ochoa in the past.  He reports intermittent Left hand numbness and intermittent Left buttock to ankle pain.  Symptoms are activity & ROM dependent.   Mobic 7.5mg  2/day Tyelnol 500mg  4/day  08/01/12 L4-5 decompresive laminectomy by Dr. Luiz Ochoa  MRI, X-ray 2014 on [facility]  I asked the patient how he is doing since he last saw Dr. Luiz Ochoa and he says that he is continuing to get worse.  He says his left hand is going numb and per my  evaluation he has a positive Tinel sign on the left wrist and also positive Phalen's on the left wrist as well as a mildly positive Spurling maneuver to the left.  I do think he is symptoms are more consistent with carpal tunnel syndrome than cervical pathology.  He is also complaining of low back pain and pain into his left leg and calf and says that he has left leg pain when he stands up straight and low back pain when he flexes but no leg pain with flexion.  He has left-sided low back pain to palpation and also left sciatic notch discomfort.  He says these are new findings.  I have suggested we get an MRI of the lumbar spine with and without gadolinium and also for view lumbar radiographs.  I've also recommended that he wear a wrist splint and I fitted him for one today.      Medical/Surgical/Interim History Reviewed, no change.  Last detailed document date:03/30/2013.   PAST MEDICAL HISTORY, SURGICAL HISTORY, FAMILY HISTORY, SOCIAL HISTORY AND REVIEW OF SYSTEMS I have reviewed the patient's past medical, surgical, family and social history as well as the comprehensive review of systems as included on the [facility] history form dated 08/02/2013, which I have signed.  Family History: Reviewed, no changes.  Last detailed document: 03/30/2013.   Social History: Tobacco use reviewed. Reviewed, no changes. Last detailed document date: 03/30/2013.      MEDICATIONS(added, continued or stopped this visit):   Started Medication Directions Instruction Stopped  08/02/2013 meloxicam 7.5 mg tablet take 1 tablet by oral route 2 times every day as needed     Prilosec OTC 20 mg tablet,delayed release take 1 tablet by mouth daily     Tylenol Extra Strength 500 mg tablet take 2 tablet by oral route 2 times every day      ALLERGIES:  Ingredient Reaction Medication Name Comment  NO KNOWN ALLERGIES     No known allergies. Reviewed, no changes.   Vitals Date Temp F  BP Pulse Ht In Wt Lb BMI BSA Pain Score  02/21/2014  132/81 64 74 245 31.46  4/10        IMPRESSION Patient will follow-up with me will lumbar imaging and to assess his interval progress with usage of a wrist splint.  Completed Orders (this encounter) Order Details Reason Side Interpretation Result Initial Treatment Date Region  Lifestyle education regarding diet Encouraged to eat a well balanced diet and follow up with primary care physician.        Lumbar Spine- AP/Lat/Flex/Ex      02/21/2014    Assessment/Plan # Detail Type Description   1. Assessment Carpal tunnel syndrome (354.0).       2. Assessment Lumbago (724.2).  3. 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   Plan Orders BUN And Creatinine to be performed.       4. Assessment BMI 31.0-31.9,ADULT (V85.31).   Plan Orders Today's instructions / counseling include(s) Lifestyle education regarding diet.         Pain Assessment/Treatment Pain Scale: 4/10. Method: Numeric Pain Intensity Scale. Location: back. Onset: 11/21/2013. Duration: varies. Quality: dull. Pain Assessment/Treatment follow-up plan of care: Patient taking Ibuprofen/Tylenol for pain..  Follow-up with imaging and we will also reassess severity of carpal tunnel syndrome  Orders: Labs: Assessment Test Status  724.03 BUN And Creatinine Scheduled  Diagnostic Procedures: Assessment Procedure  724.02 Lumbar Spine- AP/Lat/Flex/Ex  724.03 MRI Spine/lumb With & W/o Contrast  Instruction(s)/Education: Assessment Instruction  V85.31 Lifestyle education regarding diet             Provider:  Marchia Meiers. Vertell Limber MD  02/22/2014 06:39 PM Dictation edited by: Marchia Meiers. Vertell Limber    CC Providers: Lovette Cliche II [facility] [address] [place],  [place]  [address]-   Dempsey Hirsch MD [address] [place], [place]  [address] ----------------------------------------------------------------------------------------------------------------------------------------------------------------------         Electronically signed by Marchia Meiers. Vertell Limber MD on 02/22/2014 06:39 PM

## 2014-08-07 NOTE — Plan of Care (Signed)
Problem: Consults Goal: Diagnosis - Spinal Surgery Outcome: Completed/Met Date Met:  08/07/14 [facility]/Lumbar Spine Fusion

## 2014-08-07 NOTE — Progress Notes (Signed)
Lunch relief by S. Gregson RN 

## 2014-08-07 NOTE — Interval H&P Note (Signed)
History and Physical Interval Note:  08/07/2014 7:10 AM  Jonathon Gonzales  has presented today for surgery, with the diagnosis of Stenosis, Lumbago, Spondylosis, synovial cyst  The various methods of treatment have been discussed with the patient and family. After consideration of risks, benefits and other options for treatment, the patient has consented to  Procedure(s) with comments: Redo Laminectomy/Resection of synovial cyst L4-5 with Fusion (MAS PLIF) (N/A) - Redo Laminectomy/Resection of synovial cyst L4-5 with Fusion as a surgical intervention .  The patient's history has been reviewed, patient examined, no change in status, stable for surgery.  I have reviewed the patient's chart and labs.  Questions were answered to the patient's satisfaction.     Jonathon Gonzales

## 2014-08-07 NOTE — Transfer of Care (Signed)
Immediate Anesthesia Transfer of Care Note  Patient: Jonathon Gonzales  Procedure(s) Performed: Procedure(s): Redo Laminectomy/Resection of synovial cyst Lumbar four-five with Fusion Mass Access Posterior Lumbar Interbody Fusion (N/A)  Patient Location: PACU  Anesthesia Type:General  Level of Consciousness: awake, alert  and oriented  Airway & Oxygen Therapy: Patient Spontanous Breathing and Patient connected to face mask oxygen  Post-op Assessment: Report given to PACU RN  Post vital signs: Reviewed and stable  Complications: No apparent anesthesia complications

## 2014-08-07 NOTE — Plan of Care (Signed)
Problem: Consults Goal: Spinal Surgery Patient Education See Patient Education Module for education specifics. Outcome: Completed/Met Date Met:  08/07/14

## 2014-08-07 NOTE — Progress Notes (Signed)
Patient ID: Jonathon Gonzales, male   DOB: 1951-08-10, 63 y.o.   MRN: [id number] Alert, conversant. Reports significant lumbar and right hip pain, having just requested & rec'd pain med.  Good strength BLE. Hemovac patent. Drsg intact. Family present voicing concern about pain management & her feeling that pt may not ask for pain med "until it goes too long".  Reassured re: protocols and nursing knowledge & judgement based on patient's presentation coupled with pts requests. Will continue to mobilize in LSO.   Verdis Prime RN BSN

## 2014-08-07 NOTE — Op Note (Signed)
08/07/2014  11:48 AM  PATIENT:  Jonathon Gonzales  63 y.o. 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  Decompression greater than for standard PLIF procedure.  SURGEON:  Surgeon(s) and Role:    * Erline Levine, MD - Primary    * Charlie Pitter, MD - Assisting  PHYSICIAN ASSISTANT:   ASSISTANTS: Poteat, RN   ANESTHESIA:   general  EBL:  Total I/O In: 1610 [I.V.:2000; IV Piggyback:250] Out: 800 [Urine:300; Blood:500]  BLOOD ADMINISTERED:none  DRAINS: (Medium) Hemovact drain(s) in the epidural space with  Suction Open   LOCAL MEDICATIONS USED:  MARCAINE     SPECIMEN:  No Specimen  DISPOSITION OF SPECIMEN:  N/A  COUNTS:  YES  TOURNIQUET:  * No tourniquets in log *  DICTATION: Patient is a 63 year old with spondylolisthesis, recurrent stenosis, large synovial cyst, disc herniation and severe back and bilateral lower extremity pain at L4/5 levels of the lumbar spine. It was elected to take him to surgery for redo decompression with resection of recurrent synovial cyst and MASPLIF L 45 level with posterolateral arthrodesis.  Procedure:   Following uncomplicated induction of GETA, and placement of electrodes for neural monitoring, patient was turned into a prone position on the Simpson tableand using AP  fluoroscopy the area of planned incision was marked, prepped with betadine scrub and Duraprep, then draped. Exposure was performed of facet joint complex at L 45 level and the MAS retractor was placed.5.5 x 35 mm cortical Nuvasive screws were placed at L 4 bilaterally according to  standard landmarks using neural monitoring.  A total laminectomy of the remaining aspect of the L 4 laminae was then performed with disarticulation of facets.  Painstaking microdissection was performed with laminectomy of the superior aspect of the L 5 lamina, particularly on the left with resection of recurrent synovial cyst and decompression of the thecal sac. Bone was saved for grafting, combined with Osteocel after being run through bone mill and was placed in bone packing device.  Thorough discectomy was performed bilaterally at L 45 and the endplates were prepared for grafting.  23 x 8 x 12 degree cages were placed in the interspace and positioning was confirmed with AP and lateral fluoroscopy.  10 cc of autograft/Osteocel was packed in the interspace medial to the second cage.   Decompression was greater than standard PLIF procedure with painstaking dissection of scar tissue and thorough decompression of both L 4, L 5 nerve roots and the thecal sac. Remaining screws were placed at L 5 and 35 mm rods were placed.   And the screws were locked and torqued.Final Xrays showed well positioned implants and screw fixation. The posterolateral region was packed with remaining 10 cc of autograft on the right of midline. A medium Hemovac drain was placed.The wounds were irrigated and then closed with 1, 2-0 and 3-0 Vicryl stitches. Sterile occlusive dressing was placed with Dermabond. The patient was then extubated in the operating room and taken to recovery in stable and satisfactory condition having tolerated her operation well. Counts were correct at the end of the case.  PLAN OF CARE: Admit to inpatient   PATIENT DISPOSITION:  PACU - hemodynamically stable.   Delay start  of Pharmacological VTE agent (>24hrs) due to surgical blood loss or risk of bleeding: yes

## 2014-08-07 NOTE — Anesthesia Procedure Notes (Signed)
Procedure Name: Intubation Date/Time: 08/07/2014 7:43 AM Performed by: Katheryne Gorr, Virgel Gess Pre-anesthesia Checklist: Patient identified, Timeout performed, Emergency Drugs available, Suction available and Patient being monitored Patient Re-evaluated:Patient Re-evaluated prior to inductionOxygen Delivery Method: Circle system utilized Preoxygenation: Pre-oxygenation with 100% oxygen Intubation Type: IV induction Ventilation: Oral airway inserted - appropriate to patient size and Mask ventilation without difficulty Laryngoscope Size: Mac and 4 Grade View: Grade II Tube type: Oral Tube size: 8.0 mm Number of attempts: 1 Airway Equipment and Method: Stylet and Bite block Placement Confirmation: ETT inserted through vocal cords under direct vision,  positive ETCO2,  CO2 detector and breath sounds checked- equal and bilateral Secured at: 23 cm Tube secured with: Tape Dental Injury: Teeth and Oropharynx as per pre-operative assessment

## 2014-08-07 NOTE — Anesthesia Postprocedure Evaluation (Signed)
  Anesthesia Post-op Note  Patient: Jonathon Gonzales  Procedure(s) Performed: Procedure(s): Redo Laminectomy/Resection of synovial cyst Lumbar four-five with Fusion Mass Access Posterior Lumbar Interbody Fusion (N/A)  Patient Location: PACU  Anesthesia Type:General  Level of Consciousness: awake and alert   Airway and Oxygen Therapy: Patient Spontanous Breathing and Patient connected to nasal cannula oxygen  Post-op Pain: moderate  Post-op Assessment: Post-op Vital signs reviewed  Post-op Vital Signs: Reviewed and stable  Last Vitals:  Filed Vitals:   08/07/14 1230  BP: 131/65  Pulse: 68  Temp:   Resp: 16    Complications: No apparent anesthesia complications

## 2014-08-07 NOTE — Evaluation (Signed)
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  Pt currently limited functionally due to the problems listed below.  (see problems list.)  Pt will benefit from PT to maximize function and safety to be able to get home safely with available assist of family.     Follow Up Recommendations No PT follow up    Equipment Recommendations  None recommended by PT    Recommendations for Other Services       Precautions / Restrictions Precautions Precautions: Back Required Braces or Orthoses: Spinal Brace Spinal Brace: Lumbar corset;Applied in sitting position      Mobility  Bed Mobility Overal bed mobility: Needs Assistance Bed Mobility: Sidelying to Sit   Sidelying to sit: Min assist       General bed mobility comments: reinforced technique and assist coming up to elbow.  Transfers Overall transfer level: Needs assistance   Transfers: Sit to/from Stand Sit to Stand: Min assist         General transfer comment: cues for hand placement  Ambulation/Gait Ambulation/Gait assistance: Min guard Ambulation Distance (Feet): 160 Feet Assistive device: Rolling walker (2 wheeled);None Gait Pattern/deviations: Step-through pattern;Decreased step length - right;Decreased step length - left;Decreased stride length;Trunk flexed Gait velocity: slow   General Gait Details: short shuffled steps with very little variability in speed.  Stairs            Wheelchair Mobility    Modified Rankin (Stroke Patients Only)       Balance Overall balance assessment: No apparent balance deficits (not formally assessed)                                           Pertinent Vitals/Pain Pain  Assessment: 0-10 Pain Score: 6  Pain Location: back incision Pain Descriptors / Indicators: Aching;Constant Pain Intervention(s): Premedicated before session    Home Living Family/patient expects to be discharged to:: Private residence Living Arrangements: Spouse/significant other Available Help at Discharge: Family Type of Home: House Home Access: Stairs to enter Entrance Stairs-Rails: Psychiatric nurse of Steps: 3 Home Layout: One level Home Equipment: Environmental consultant - 2 wheels;Crutches;Bedside commode;Shower seat;Wheelchair - manual      Prior Function Level of Independence: Independent               Hand Dominance        Extremity/Trunk Assessment   Upper Extremity Assessment: Overall WFL for tasks assessed           Lower Extremity Assessment: Overall WFL for tasks assessed         Communication   Communication: No difficulties  Cognition Arousal/Alertness: Awake/alert Behavior During Therapy: WFL for tasks assessed/performed Overall Cognitive Status: Within Functional Limits for tasks assessed                      General Comments General comments (skin integrity, edema, etc.): Instructed in back care/precautions, bracing issues, lifting precautions, bed mobility (logroll), and progression of activity after d/c    Exercises        Assessment/Plan    PT Assessment Patient needs continued PT services  PT Diagnosis Difficulty walking;Acute pain  PT Problem List Decreased strength;Decreased activity tolerance;Decreased mobility;Decreased knowledge of use of DME;Decreased knowledge of precautions;Pain  PT Treatment Interventions DME instruction;Gait training;Functional mobility training;Stair training;Therapeutic activities;Patient/family education   PT Goals (Current goals can be found in the Care Plan section) Acute Rehab PT Goals Patient Stated Goal: Independence PT Goal Formulation: With patient Time For Goal Achievement:  08/14/14 Potential to Achieve Goals: Good    Frequency Min 5X/week   Barriers to discharge        Co-evaluation               End of Session Equipment Utilized During Treatment: Back brace Activity Tolerance: Patient tolerated treatment well Patient left: in chair;with call bell/phone within reach;with family/visitor present Nurse Communication: Mobility status         Time: 5176-1607 PT Time Calculation (min) (ACUTE ONLY): 35 min   Charges:   PT Evaluation $Initial PT Evaluation Tier I: 1 Procedure PT Treatments $Gait Training: 8-22 mins $Therapeutic Activity: 8-22 mins   PT G Codes:          Lumi Winslett, Tessie Fass 08/07/2014, 5:41 PM  08/07/2014  Donnella Sham, PT [phone]  (pager)

## 2014-08-07 NOTE — Brief Op Note (Signed)
08/07/2014  11:48 AM  PATIENT:  Jonathon Gonzales  63 y.o. 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  Decompression greater than for standard PLIF procedure.  SURGEON:  Surgeon(s) and Role:    * Erline Levine, MD - Primary    * Charlie Pitter, MD - Assisting  PHYSICIAN ASSISTANT:   ASSISTANTS: [place], RN   ANESTHESIA:   general  EBL:  Total I/O In: 6378 [I.V.:2000; IV Piggyback:250] Out: 800 [Urine:300; Blood:500]  BLOOD ADMINISTERED:none  DRAINS: (Medium) Hemovact drain(s) in the epidural space with  Suction Open   LOCAL MEDICATIONS USED:  MARCAINE     SPECIMEN:  No Specimen  DISPOSITION OF SPECIMEN:  N/A  COUNTS:  YES  TOURNIQUET:  * No tourniquets in log *  DICTATION: Patient is a 63 year old with spondylolisthesis, recurrent stenosis, large synovial cyst, disc herniation and severe back and bilateral lower extremity pain at L4/5 levels of the lumbar spine. It was elected to take him to surgery for redo decompression with resection of recurrent synovial cyst and MASPLIF L 45 level with posterolateral arthrodesis.  Procedure:   Following uncomplicated induction of GETA, and placement of electrodes for neural monitoring, patient was turned into a prone position on the [place] tableand using AP  fluoroscopy the area of planned incision was marked, prepped with betadine scrub and Duraprep, then draped. Exposure was performed of facet joint complex at L 45 level and the MAS retractor was placed.5.5 x 35 mm cortical Nuvasive screws were placed at L 4 bilaterally according to  standard landmarks using neural monitoring.  A total laminectomy of the remaining aspect of the L 4 laminae was then performed with disarticulation of facets.  Painstaking microdissection was performed with laminectomy of the superior aspect of the L 5 lamina, particularly on the left with resection of recurrent synovial cyst and decompression of the thecal sac. Bone was saved for grafting, combined with Osteocel after being run through bone mill and was placed in bone packing device.  Thorough discectomy was performed bilaterally at L 45 and the endplates were prepared for grafting.  23 x 8 x 12 degree cages were placed in the interspace and positioning was confirmed with AP and lateral fluoroscopy.  10 cc of autograft/Osteocel was packed in the interspace medial to the second cage.   Decompression was greater than standard PLIF procedure with painstaking dissection of scar tissue and thorough decompression of both L 4, L 5 nerve roots and the thecal sac. Remaining screws were placed at L 5 and 35 mm rods were placed.   And the screws were locked and torqued.Final Xrays showed well positioned implants and screw fixation. The posterolateral region was packed with remaining 10 cc of autograft on the right of midline. A medium Hemovac drain was placed.The wounds were irrigated and then closed with 1, 2-0 and 3-0 Vicryl stitches. Sterile occlusive dressing was placed with Dermabond. The patient was then extubated in the operating room and taken to recovery in stable and satisfactory condition having tolerated her operation well. Counts were correct at the end of the case.  PLAN OF CARE: Admit to inpatient   PATIENT DISPOSITION:  PACU - hemodynamically stable.   Delay start  of Pharmacological VTE agent (>24hrs) due to surgical blood loss or risk of bleeding: yes

## 2014-08-08 MED ORDER — OXYCODONE-ACETAMINOPHEN 5-325 MG PO TABS: 1.0000 | ORAL_TABLET | ORAL | Status: DC | PRN

## 2014-08-08 MED ORDER — METHOCARBAMOL 500 MG PO TABS: 500.0000 mg | ORAL_TABLET | Freq: Four times a day (QID) | ORAL | Status: DC | PRN

## 2014-08-08 NOTE — Discharge Summary (Signed)
Physician Discharge Summary  Patient ID: Jonathon Gonzales Jonathon Gonzales: [id number] DOB/AGE: [birth year] 63 y.o.  Admit date: 08/07/2014 Discharge date: 08/08/2014  Admission Diagnoses:Spondylolisthesis, synovial cyst, recurrent stenosis, radiculopathy L 45  Discharge Diagnoses: Spondylolisthesis, synovial cyst, recurrent stenosis, radiculopathy L 45  Active Problems:   Synovial cyst of lumbar facet joint   Discharged Condition: good  Hospital Course: Patient underwent redo decompression and fusion L 45 level with resection of synovial cyst and did well post-operatively  Consults: None  Significant Diagnostic Studies: None  Treatments: surgery: edo decompression and fusion L 45 level with resection of synovial cyst  Discharge Exam: Blood pressure 139/66, pulse 81, temperature 98.7 F (37.1 C), temperature source Oral, resp. rate 20, SpO2 97 %. Neurologic: Alert and oriented X 3, normal strength and tone. Normal symmetric reflexes. Normal coordination and gait Wound:CDI  Disposition: Home     Medication List    TAKE these medications        acetaminophen 500 MG tablet  Commonly known as:  TYLENOL  Take 1,000 mg by mouth 2 (two) times daily as needed (for pain).     meloxicam 7.5 MG tablet  Commonly known as:  MOBIC  Take 7.5 mg by mouth 2 (two) times daily.     methocarbamol 500 MG tablet  Commonly known as:  ROBAXIN  Take 1 tablet (500 mg total) by mouth every 6 (six) hours as needed for muscle spasms.     multivitamin with minerals Tabs tablet  Take 1 tablet by mouth daily.     omeprazole 20 MG capsule  Commonly known as:  PRILOSEC  Take 20 mg by mouth daily.     oxyCODONE-acetaminophen 5-325 MG per tablet  Commonly known as:  PERCOCET/ROXICET  Take 1-2 tablets by mouth every 4 (four) hours as needed for moderate pain or severe pain.           Follow-up Information    Follow up with Peggyann Shoals, MD.   Specialty:  Neurosurgery   Contact information:   [address] [place] [address] [phone]       Signed: Peggyann Shoals, MD 08/08/2014, 7:54 AM

## 2014-08-08 NOTE — Plan of Care (Signed)
Problem: Phase II Progression Outcomes Goal: Progress activity as tolerated unless otherwise ordered Outcome: Progressing Goal: Discharge plan established Outcome: Progressing Goal: Tolerating diet Outcome: Progressing Goal: Verbalizes of donning/doffing brace Outcome: Progressing Goal: Understands assist devices with ambulation Outcome: Progressing Goal: PT/OT consults completed Outcome: Progressing

## 2014-08-08 NOTE — Progress Notes (Signed)
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  Patient still very slow with how he walks and seems limited at home in terms of ways to progress ambulation [place]  Feel HHPT would assist with safey in the home and further education on activity progression. Will check back in AM prior to d/c.  Follow Up Recommendations  Home health PT     Equipment Recommendations  None recommended by PT    Recommendations for Other Services       Precautions / Restrictions Precautions Precautions: Back Precaution Comments: Reviewed back precautions Required Braces or Orthoses: Spinal Brace Spinal Brace: Lumbar corset;Applied in sitting position (wife assisted to don) Restrictions Weight Bearing Restrictions: No    Mobility  Bed Mobility Overal bed mobility: Needs Assistance Bed Mobility: Rolling;Sit to Sidelying Rolling: Supervision Sidelying to sit: Supervision     Sit to sidelying: Supervision General bed mobility comments: cues for technique with sit to supine, use of railing to roll  Transfers Overall transfer level: Needs assistance Equipment used: Rolling walker (2 wheeled) Transfers: Sit to/from Stand Sit to Stand: Supervision         General transfer comment: UE use due to right knee weakness  Ambulation/Gait Ambulation/Gait assistance: Supervision Ambulation Distance (Feet): 200 Feet Assistive device: Rolling walker (2 wheeled) Gait Pattern/deviations: Step-through pattern;Decreased stride length     General Gait Details: adjusted walker for height and educated wife how to adjust properly; patient with slow, steady pace and short steps   Stairs Stairs: Yes Stairs  assistance: Min assist Stair Management: One rail Right;Forwards;Step to pattern (and hand held assist) Number of Stairs: 3 General stair comments: leads with left leg to ascend and right to descend  Wheelchair Mobility    Modified Rankin (Stroke Patients Only)       Balance Overall balance assessment: No apparent balance deficits (not formally assessed)                                  Cognition Arousal/Alertness: Awake/alert Behavior During Therapy: WFL for tasks assessed/performed Overall Cognitive Status: Within Functional Limits for tasks assessed                      Exercises      General Comments General comments (skin integrity, edema, etc.): discussed activity progression, sitting locations for precautions, car transfers, and stair negotiation procedure for home entry      Pertinent Vitals/Pain Pain Assessment: 0-10 Pain Score: 2  Pain Location: right hip Pain Intervention(s): Monitored during session    Home Living Family/patient expects to be discharged to:: Private residence Living Arrangements: Spouse/significant other Available Help at Discharge: Family Type of Home: House Home Access: Stairs to enter Entrance Stairs-Rails: Right;Left Home Layout: One level Home Equipment: Environmental consultant - 2 wheels;Crutches;Bedside commode;Shower seat;Wheelchair - Psychologist, educational      Prior Function Level of Independence: Independent          PT Goals (current goals can now be found in the care plan section) Acute Rehab PT Goals Patient Stated Goal: not stated Progress towards PT goals: Progressing toward goals    Frequency  Min 5X/week  PT Plan Discharge plan needs to be updated    Co-evaluation             End of Session Equipment Utilized During Treatment: Gait belt;Back brace Activity Tolerance: Patient tolerated treatment well Patient left: in bed;with call bell/phone within reach;with family/visitor present      Time: 0923-0959 PT Time Calculation (min) (ACUTE ONLY): 36 min  Charges:  $Gait Training: 8-22 mins $Self Care/Home Management: 8-22                    G Codes:      WYNN,CYNDI 2014-08-29, 10:06 AM Magda Kiel, [place] Aug 29, 2014

## 2014-08-08 NOTE — Progress Notes (Signed)
Subjective: Patient reports doing well  Objective: Vital signs in last 24 hours: Temp:  [97.3 F (36.3 C)-98.7 F (37.1 C)] 98.7 F (37.1 C) (12/09 0412) Pulse Rate:  [56-101] 81 (12/09 0412) Resp:  [7-20] 20 (12/09 0412) BP: (105-148)/(55-79) 139/66 mmHg (12/09 0412) SpO2:  [96 %-100 %] 97 % (12/09 0412) FiO2 (%):  [2 %] 2 % (12/08 1735)  Intake/Output from previous day: 12/08 0701 - 12/09 0700 In: 3430 [P.O.:780; I.V.:2400; IV Piggyback:250] Out: 4081 [Urine:700; Emesis/NG output:200; Drains:430; Blood:500] Intake/Output this shift:    Physical Exam: Full strength.  Dressing CDI  Lab Results: No results for input(s): WBC, HGB, HCT, PLT in the last 72 hours. BMET No results for input(s): NA, K, CL, CO2, GLUCOSE, BUN, CREATININE, CALCIUM in the last 72 hours.  Studies/Results: Dg Lumbar Spine 2-3 Views  08/07/2014   CLINICAL DATA:  L4-L5 MAS  PLIF.  Intraoperative localization.  EXAM: DG C-ARM 61-120 MIN; LUMBAR SPINE - 2-3 VIEW  COMPARISON:  MRI 03/08/2014.  FINDINGS: AP and lateral fluoroscopic spot films demonstrate placement of pedicle screws at L4 and L5 with L4-L5 discectomy.  IMPRESSION: L4-L5 posterior lumbar interbody fusion.   Electronically Signed   By: Dereck Ligas M.D.   On: 08/07/2014 12:51   Dg C-arm 1-60 Min  08/07/2014   CLINICAL DATA:  L4-L5 MAS  PLIF.  Intraoperative localization.  EXAM: DG C-ARM 61-120 MIN; LUMBAR SPINE - 2-3 VIEW  COMPARISON:  MRI 03/08/2014.  FINDINGS: AP and lateral fluoroscopic spot films demonstrate placement of pedicle screws at L4 and L5 with L4-L5 discectomy.  IMPRESSION: L4-L5 posterior lumbar interbody fusion.   Electronically Signed   By: Dereck Ligas M.D.   On: 08/07/2014 12:51    Assessment/Plan: Doing well.  Mobilize today, potentially D/C this pm or in am.    LOS: 1 day    Mahli Glahn D, MD 08/08/2014, 7:53 AM

## 2014-08-08 NOTE — Care Management Note (Signed)
    Page 1 of 1   08/08/2014     3:53:56 PM CARE MANAGEMENT NOTE 08/08/2014  Patient:  Jonathon Gonzales, Jonathon Gonzales   Account Number:  [id number]  Date Initiated:  08/08/2014  Documentation initiated by:  Tomi Bamberger  Subjective/Objective Assessment:   dx s/p redo decompression and fusion  admit- from home.     Action/Plan:   pt eval- rec hhpt   Anticipated DC Date:  08/09/2014   Anticipated DC Plan:  [place]  CM consult      [facility] Choice  HOME HEALTH   Choice offered to / List presented to:  C-1 Patient        [place] arranged  [place] PT      [place].   Status of service:  Completed, signed off Medicare Important Message given?  NO (If response is "NO", the following Medicare IM given date fields will be blank) Date Medicare IM given:   Medicare IM given by:   Date Additional Medicare IM given:   Additional Medicare IM given by:    Discharge Disposition:  [place]  Per UR Regulation:  Reviewed for med. necessity/level of care/duration of stay  If discussed at [place] Walk of Stay Meetings, dates discussed:    Comments:  08/08/14 [place], BSN [phone] patient chose [facility] for HHPT, referral made to [facility], [place] notified.  Soc will begin 24-48 hrs post dc.

## 2014-08-08 NOTE — Evaluation (Addendum)
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 Feel pt will benefit from acute OT to increase independence with BADLs prior to d/c as well as reinforce precautions. Plan to practice LB ADLs and shower transfer next session.     Follow Up Recommendations  No OT follow up;Supervision - Intermittent    Equipment Recommendations  Other (comment) (AE)    Recommendations for Other Services       Precautions / Restrictions Precautions Precautions: Back Precaution Comments: Reviewed back precautions Required Braces or Orthoses: Spinal Brace Spinal Brace: Lumbar corset;Applied in sitting position (already donned prior to OT session-doffed brace sitting) Restrictions Weight Bearing Restrictions: No      Mobility Bed Mobility Overal bed mobility: Needs Assistance Bed Mobility: Rolling;Sit to Sidelying Rolling: Modified independent (Device/Increase time)       Sit to sidelying: Min assist General bed mobility comments: assist with LE's.  Transfers Overall transfer level: Needs assistance   Transfers: Sit to/from Stand Sit to Stand: Supervision              Balance                                            ADL Overall ADL's : Needs assistance/impaired                 Upper Body Dressing : Set up;Supervision/safety;Sitting   Lower Body Dressing: Minimal assistance;With adaptive equipment;Sit to/from stand   Toilet Transfer: Ambulation;RW;Supervision/safety   Toileting- Water quality scientist and Hygiene: Supervision/safety;Sit to/from stand;Set up       Functional mobility during ADLs: Rolling walker;Supervision/safety General ADL Comments: Educated on AE/cost/where they can purchase. Educated  on use of cup for oral care and placement of grooming items to avoid breaking precautions. Educated on safety (pets, rugs, sitting for LB ADLs). Pt practiced with reacher/sockaid.  Educated on car transfer technique. Educated on back brace. OT briefly talked about alternative technique for tub transfer (backing and swinging legs in using flat chair). Pt plans to use walk in shower. Educated on what pt could use for toilet aide if it is an issue (simulated hygiene standing and pt looked as he was maintaining precautions). Discussed positioning in chairs at home.      Vision                     Perception     Praxis      Pertinent Vitals/Pain Pain Assessment: 0-10 Pain Score: 3  Pain Location: back Pain Intervention(s): Repositioned;Monitored during session     Hand Dominance Right   Extremity/Trunk Assessment Upper Extremity Assessment Upper Extremity Assessment: Overall WFL for tasks assessed   Lower Extremity Assessment Lower Extremity Assessment: Defer to PT evaluation       Communication Communication Communication: No difficulties   Cognition Arousal/Alertness: Awake/alert Behavior During Therapy: WFL for tasks assessed/performed Overall Cognitive Status: Within Functional Limits for tasks assessed                     General Comments       Exercises       Shoulder Instructions      Home Living  Family/patient expects to be discharged to:: Private residence Living Arrangements: Spouse/significant other Available Help at Discharge: Family Type of Home: House Home Access: Stairs to enter [employer] of Steps: 3 Entrance Stairs-Rails: Right;Left Home Layout: One level     Bathroom Shower/Tub: Tub/shower unit;Walk-in shower   Bathroom Toilet: Handicapped height     Home Equipment: Walker - 2 wheels;Crutches;Bedside commode;Shower seat;Wheelchair - Scientist, physiological: Long-handled Corporate treasurer         Prior Functioning/Environment Level of Independence: Independent             OT Diagnosis: Acute pain   OT Problem List: Decreased strength;Decreased range of motion;Pain;Decreased knowledge of use of DME or AE;Decreased knowledge of precautions   OT Treatment/Interventions: Self-care/ADL training;DME and/or AE instruction;Patient/family education;Balance training;Therapeutic activities    OT Goals(Current goals can be found in the care plan section) Acute Rehab OT Goals Patient Stated Goal: not stated OT Goal Formulation: With patient Time For Goal Achievement: 08/15/14 Potential to Achieve Goals: Good ADL Goals Pt Will Perform Grooming: with modified independence;standing Pt Will Perform Lower Body Dressing: with modified independence;with adaptive equipment;sit to/from stand Pt Will Transfer to Toilet: with modified independence;ambulating (3 in 1 over commode) Pt Will Perform Toileting - Clothing Manipulation and hygiene: with modified independence;sit to/from stand Pt Will Perform Tub/Shower Transfer: Shower transfer;with supervision;ambulating;shower seat  OT Frequency: Min 2X/week   Barriers to D/C:            Co-evaluation              End of Session Equipment Utilized During Treatment: Gait belt;Rolling walker;Back brace  Activity Tolerance: Patient tolerated treatment well Patient left: in bed;with call bell/phone within reach;with family/visitor present   Time: 1749-4496 OT Time Calculation (min): 27 min Charges:  OT General Charges $OT Visit: 1 Procedure OT Evaluation $Initial OT Evaluation Tier I: 1 Procedure OT Treatments $Self Care/Home Management : 8-22 mins G-CodesBenito Mccreedy OTR/L [phone] 08/08/2014, 9:50 AM

## 2014-08-09 NOTE — Progress Notes (Signed)
Physical Therapy Treatment and Discharge Patient Details Name: Jonathon Gonzales MRN: [id number] DOB: July 10, 1951 Today's Date: 08/09/2014    History of Present Illness pt admitted with recurrent stenosis, spondylolisthesis, and synovial cyst L45, s/p redo lami and PLIF    PT Comments    Pt has been able to safely perform all mobility with supervision to mod independence. Pt safe and stable with ambulation today with no loss of balance noted. Pt did not require cuing for transfers or to maintain back precautions and was able to verbalize precautions when asked. Pt able to independently don/doff back brace and was educated on importance of not sitting for longer than 30-45 minutes without sitting up. Pt and wife had no further questions and feel comfortable going home. Pt still in some pain and requesting pain medication after session. RN notified of patient's request. Acute PT is signing off on this patient.    Follow Up Recommendations  Home health PT     Equipment Recommendations  None recommended by PT    Recommendations for Other Services       Precautions / Restrictions Precautions Precautions: Back Precaution Booklet Issued: Yes (comment) Precaution Comments: Provideed and reviewed back precaution sheet Required Braces or Orthoses: Spinal Brace Spinal Brace: Lumbar corset;Applied in sitting position Restrictions Weight Bearing Restrictions: No    Mobility  Bed Mobility Overal bed mobility: Needs Assistance Bed Mobility: Rolling;Sit to Sidelying Rolling: Supervision Sidelying to sit: Supervision       General bed mobility comments: Pt able to perform log roll technique without physical assistance or verbal cues while maintaining back precautions.   Transfers Overall transfer level: Needs assistance Equipment used: Rolling walker (2 wheeled) Transfers: Sit to/from Stand Sit to Stand: Supervision         General transfer comment: Pt able to transfer from lowered bed  with out physical assistance or verbal cues. Pt able to maintain back precautions, Pt supervision for safety.   Ambulation/Gait Ambulation/Gait assistance: Supervision Ambulation Distance (Feet): 200 Feet Assistive device: Rolling walker (2 wheeled) Gait Pattern/deviations: Step-through pattern;Decreased stride length Gait velocity: Decreased Gait velocity interpretation: Below normal speed for age/gender General Gait Details: Pt able to ambulate safely in hallway without loss of balance. Pt with limited stride length due to pain. Pt requesting pain meds after ambulation.   Stairs         General stair comments: Pt did not want to practice stairs. Stated he felt comfortable with the practice he had yesterday.   Wheelchair Mobility    Modified Rankin (Stroke Patients Only)       Balance Overall balance assessment: Needs assistance Sitting-balance support: Feet supported;No upper extremity supported Sitting balance-Leahy Scale: Good     Standing balance support: No upper extremity supported Standing balance-Leahy Scale: Fair Standing balance comment: Pt able to stand statically without support but requires use of RW for safe ambulation.                     Cognition Arousal/Alertness: Awake/alert Behavior During Therapy: WFL for tasks assessed/performed Overall Cognitive Status: Within Functional Limits for tasks assessed                      Exercises      General Comments General comments (skin integrity, edema, etc.): Pt able to don brace indepenently without cuing.       Pertinent Vitals/Pain Pain Assessment: 0-10 Pain Score: 4  Pain Location: Back and Right hip Pain Intervention(s): Limited activity  within patient's tolerance;Monitored during session;Patient requesting pain meds-RN notified;Repositioned    Home Living                      Prior Function            PT Goals (current goals can now be found in the care plan  section) Progress towards PT goals: Goals met/education completed, patient discharged from PT    Frequency       PT Plan Current plan remains appropriate    Co-evaluation             End of Session Equipment Utilized During Treatment: Gait belt;Back brace Activity Tolerance: Patient tolerated treatment well Patient left: in chair;with call bell/phone within reach;with family/visitor present     Time: 3081-6838 PT Time Calculation (min) (ACUTE ONLY): 17 min  Charges:  $Gait Training: 8-22 mins                    G CodesJearld Shines SPT 08/09/2014, 9:20 AM   Jearld Shines, [place]  [facility] [phone]

## 2014-08-09 NOTE — Discharge Instructions (Signed)
Wound Care °Leave incision open to air. °You may shower. °Do not scrub directly on incision.  °Do not put any creams, lotions, or ointments on incision. °Activity °Walk each and every day, increasing distance each day. °No lifting greater than 5 lbs.  Avoid bending, arching, and twisting. °No driving for 2 weeks; may ride as a passenger locally. °If provided with back brace, wear when out of bed.  It is not necessary to wear in bed. °Diet °Resume your normal diet.  °Return to Work °Will be discussed at you follow up appointment. °Call Your Doctor If Any of These Occur °Redness, drainage, or swelling at the wound.  °Temperature greater than 101 degrees. °Severe pain not relieved by pain medication. °Incision starts to come apart. °Follow Up Appt °Call today for appointment in 3-4 weeks ([phone]) or for problems.  If you have any hardware placed in your spine, you will need an x-ray before your appointment. ° ° °Spinal Fusion °Spinal fusion is a procedure to make 2 or more of the bones in your spinal column (vertebrae) grow together (fuse). This procedure stops movement between the vertebrae and can relieve pain and prevent deformity.  °Spinal fusion is used to treat the following conditions: °· Fractures of the spine. °· Herniated disk (the spongy material [cartilage] between the vertebrae). °· Abnormal curvatures of the spine, such as scoliosis or kyphosis. °· A weak or an unstable spine, caused by infections or tumor. °RISKS AND COMPLICATIONS °Complications associated with spinal fusion are rare, but they can occur. Possible complications include: °· Bleeding. °· Infection near the incision. °· Nerve damage. Signs of nerve damage are back pain, pain in one or both legs, weakness, or numbness. °· Spinal fluid leakage. °· Blood clot in your leg, which can move to your lungs. °· Difficulty controlling urination or bowel movements. °BEFORE THE PROCEDURE °· A medical evaluation will be done. This will include a  physical exam, blood tests, and imaging exams. °· You will talk with an anesthesiologist. This is the person who will be in charge of the anesthesia during the procedure. Spinal fusion usually requires that you are asleep during the procedure (general anesthesia). °· You will need to stop taking certain medicines, particularly those associated with an increased risk of bleeding. Ask your caregiver about changing or stopping your regular medicines. °· If you smoke, you will need to stop at least 2 weeks before the procedure. Smoking can slow down the healing process, especially fusion of the vertebrae, and increase the risk of complications. °· Do not eat or drink anything for at least 8 hours before the procedure. °PROCEDURE  °A cut (incision) is made over the vertebrae that will be fused. The back muscles are separated from the vertebrae. If you are having this procedure to treat a herniated disk, the disc material pressing on the nerve root is removed (decompression). The area where the disk is removed is then filled with extra bone. Bone from another part of your body (autogenous bone) or bone from a bone donor (allograft bone) may be used. The extra bone promotes fusion between the vertebrae. Sometimes, specific medicines are added to the fusion area to promote bone healing. In most cases, screws and rods or metal plates will be used to attach the vertebrae to stabilize them while they fuse.  °AFTER THE PROCEDURE  °· You will stay in a recovery area until the anesthesia has worn off. Your blood pressure and pulse will be checked frequently. °· You will be   given antibiotics to prevent infection. °· You may continue to receive fluids through an intravenous (IV) tube while you are still in the hospital. °· Pain after surgery is normal. You will be given pain medicine. °· You will be taught how to move correctly and how to stand and walk. While in bed, you will be instructed to turn frequently, using a "log rolling"  technique, in which the entire body is moved without twisting the back. °Document Released: 05/16/2003 Document Revised: 11/09/2011 Document Reviewed: 10/30/2010 °[employer]® Patient Information ©2015 [employer]. This information is not intended to replace advice given to you by your health care provider. Make sure you discuss any questions you have with your health care provider. ° °

## 2014-08-09 NOTE — Progress Notes (Signed)
Subjective: Patient reports "I think I'm doing better"  Objective: Vital signs in last 24 hours: Temp:  [98.2 F (36.8 C)-98.5 F (36.9 C)] 98.4 F (36.9 C) (12/10 0809) Pulse Rate:  [64-76] 65 (12/10 0809) Resp:  [18-20] 18 (12/10 0809) BP: (105-132)/(58-72) 131/71 mmHg (12/10 0809) SpO2:  [96 %-99 %] 98 % (12/10 0809)  Intake/Output from previous day: 12/09 0701 - 12/10 0700 In: 960 [P.O.:960] Out: 90 [Drains:90] Intake/Output this shift:    Alert, conversant. Mild bilateral groin pain. Good strength BLE. Drsg intact. Incision without erythema, swelling, or drainage.   Lab Results: No results for input(s): WBC, HGB, HCT, PLT in the last 72 hours. BMET No results for input(s): NA, K, CL, CO2, GLUCOSE, BUN, CREATININE, CALCIUM in the last 72 hours.  Studies/Results: Dg Lumbar Spine 2-3 Views  08/07/2014   CLINICAL DATA:  L4-L5 MAS  PLIF.  Intraoperative localization.  EXAM: DG C-ARM 61-120 MIN; LUMBAR SPINE - 2-3 VIEW  COMPARISON:  MRI 03/08/2014.  FINDINGS: AP and lateral fluoroscopic spot films demonstrate placement of pedicle screws at L4 and L5 with L4-L5 discectomy.  IMPRESSION: L4-L5 posterior lumbar interbody fusion.   Electronically Signed   By: Dereck Ligas M.D.   On: 08/07/2014 12:51   Dg C-arm 1-60 Min  08/07/2014   CLINICAL DATA:  L4-L5 MAS  PLIF.  Intraoperative localization.  EXAM: DG C-ARM 61-120 MIN; LUMBAR SPINE - 2-3 VIEW  COMPARISON:  MRI 03/08/2014.  FINDINGS: AP and lateral fluoroscopic spot films demonstrate placement of pedicle screws at L4 and L5 with L4-L5 discectomy.  IMPRESSION: L4-L5 posterior lumbar interbody fusion.   Electronically Signed   By: Dereck Ligas M.D.   On: 08/07/2014 12:51    Assessment/Plan: Improving   LOS: 2 days  Per DrStern, d/c IV, d/c to home. Pt verbalizes understanding of d/c instructions and agrees to call office to schedule 3-4 wk f/u. Rx's on chart for prn use.   Verdis Prime 08/09/2014, 8:42 AM

## 2014-08-09 NOTE — Progress Notes (Signed)
Occupational Therapy Treatment and Discharge Patient Details Name: Jonathon Gonzales MRN: [id number] DOB: Jun 12, 1951 Today's Date: 08/09/2014    History of present illness pt admitted with recurrent stenosis, spondylolisthesis, and synovial cyst L45, s/p redo lami and PLIF   OT comments  Pt and wife are knowledgeable in back precautions related to ADL, IADL and ADL transfers.  No further OT needs.  Follow Up Recommendations  No OT follow up;Supervision - Intermittent    Equipment Recommendations  None recommended by OT    Recommendations for Other Services      Precautions / Restrictions Precautions Precautions: Back Precaution Booklet Issued: Yes (comment) Precaution Comments: reviewed back precautions related to ADL Required Braces or Orthoses: Spinal Brace Spinal Brace: Lumbar corset;Applied in sitting position Restrictions Weight Bearing Restrictions: No       Mobility Bed Mobility  Pt up in chair.   .   Transfers Overall transfer level: Needs assistance Transfers: Sit to/from Stand Sit to Stand: Supervision         General transfer comment: Moves slowly with good technique.    Balance Overall balance assessment: Needs assistance Sitting-balance support: Feet supported;No upper extremity supported Sitting balance-Leahy Scale: Good     Standing balance support: No upper extremity supported Standing balance-Leahy Scale: Fair Standing balance comment: Pt able to stand statically without support but requires use of RW for safe ambulation.                    ADL                                       Functional mobility during ADLs: Supervision/safety General ADL Comments: Pt's wife has bought AE and taken it home.  No concerns about use and declined practicing.  Wife asking for a hospital gown to take home as it would be easier than managing pants.  Reinforced technique for toilet transfers and pulling up pants.  Recommended tongs and wet  wipes for pericare.  Instructed in shower transfer.  Has grab bar and plans to stand using bar and long handled sponge with wife supervising. Educated pt to avoid carrying heavy items including garbage, [employer], groceries.  Pt able to demonstrate car type transfer into w/c for discharge.      Vision                     Perception     Praxis      Cognition   Behavior During Therapy: WFL for tasks assessed/performed Overall Cognitive Status: Within Functional Limits for tasks assessed                       Extremity/Trunk Assessment               Exercises     Shoulder Instructions       General Comments      Pertinent Vitals/ Pain       Pain Assessment: 0-10 Pain Score: 3  Pain Location: back Pain Descriptors / Indicators: Sore Pain Intervention(s): Premedicated before session;Monitored during session  Home Living                                          Prior Functioning/Environment  Frequency       Progress Toward Goals  OT Goals(current goals can now be found in the care plan section)  Progress towards OT goals: Progressing toward goals     Plan Discharge plan remains appropriate    Co-evaluation                 End of Session     Activity Tolerance     Patient Left Other (comment) (in w/c for d/c)   Nurse Communication          Time: 7847-8412 OT Time Calculation (min): 16 min  Charges: OT General Charges $OT Visit: 1 Procedure OT Treatments $Self Care/Home Management : 8-22 mins  Malka So 08/09/2014, 10:32 AM  [phone]

## 2014-08-09 NOTE — Progress Notes (Signed)
Pt doing well. Pt and wife given D/C instructions with Rx's, verbal understanding was provided. Pt's IV was removed prior to D/C. Pt's incision is covered with Honeycomb dressing and has no sign of infection. Pt D/C'd home via wheelchair @ 1000 per MD order. Pt is stable @ D/C and has no other needs at this time. Holli Humbles, RN

## 2014-08-14 ENCOUNTER — Encounter (HOSPITAL_COMMUNITY): Payer: Self-pay | Admitting: Neurosurgery

## 2016-04-13 DIAGNOSIS — R030 Elevated blood-pressure reading, without diagnosis of hypertension: Secondary | ICD-10-CM | POA: Diagnosis not present

## 2016-04-13 DIAGNOSIS — M545 Low back pain: Secondary | ICD-10-CM | POA: Diagnosis not present

## 2016-04-13 DIAGNOSIS — M5416 Radiculopathy, lumbar region: Secondary | ICD-10-CM | POA: Diagnosis not present

## 2016-04-13 DIAGNOSIS — Z6831 Body mass index (BMI) 31.0-31.9, adult: Secondary | ICD-10-CM | POA: Diagnosis not present

## 2016-04-14 ENCOUNTER — Other Ambulatory Visit: Payer: Self-pay | Admitting: Neurosurgery

## 2016-04-14 DIAGNOSIS — M5416 Radiculopathy, lumbar region: Secondary | ICD-10-CM

## 2016-04-15 ENCOUNTER — Other Ambulatory Visit: Payer: Self-pay | Admitting: Neurosurgery

## 2016-04-15 DIAGNOSIS — M5416 Radiculopathy, lumbar region: Secondary | ICD-10-CM

## 2016-04-23 DIAGNOSIS — M25561 Pain in right knee: Secondary | ICD-10-CM | POA: Diagnosis not present

## 2016-04-23 DIAGNOSIS — M1711 Unilateral primary osteoarthritis, right knee: Secondary | ICD-10-CM | POA: Diagnosis not present

## 2016-04-23 DIAGNOSIS — G8929 Other chronic pain: Secondary | ICD-10-CM | POA: Diagnosis not present

## 2016-04-24 ENCOUNTER — Ambulatory Visit
Admission: RE | Admit: 2016-04-24 | Discharge: 2016-04-24 | Disposition: A | Discharge status: Home / Self Care | Payer: BC Managed Care – PPO | Source: Ambulatory Visit | Attending: Neurosurgery | Admitting: Neurosurgery

## 2016-04-24 DIAGNOSIS — M4806 Spinal stenosis, lumbar region: Secondary | ICD-10-CM | POA: Diagnosis not present

## 2016-04-24 DIAGNOSIS — M5416 Radiculopathy, lumbar region: Secondary | ICD-10-CM

## 2016-04-24 MED ORDER — GADOBENATE DIMEGLUMINE 529 MG/ML IV SOLN
20.0000 mL | Freq: Once | INTRAVENOUS | Status: AC | PRN
Start: 2016-04-24 — End: 2016-04-24
  Administered 2016-04-24: 20 mL via INTRAVENOUS

## 2016-06-08 DIAGNOSIS — M5416 Radiculopathy, lumbar region: Secondary | ICD-10-CM | POA: Diagnosis not present

## 2016-06-08 DIAGNOSIS — M4316 Spondylolisthesis, lumbar region: Secondary | ICD-10-CM | POA: Diagnosis not present

## 2016-06-08 DIAGNOSIS — M48062 Spinal stenosis, lumbar region with neurogenic claudication: Secondary | ICD-10-CM | POA: Diagnosis not present

## 2016-06-08 DIAGNOSIS — M545 Low back pain: Secondary | ICD-10-CM | POA: Diagnosis not present

## 2016-06-15 DIAGNOSIS — M25511 Pain in right shoulder: Secondary | ICD-10-CM | POA: Diagnosis not present

## 2016-06-15 DIAGNOSIS — M19011 Primary osteoarthritis, right shoulder: Secondary | ICD-10-CM | POA: Diagnosis not present

## 2016-06-17 DIAGNOSIS — M6281 Muscle weakness (generalized): Secondary | ICD-10-CM | POA: Diagnosis not present

## 2016-06-17 DIAGNOSIS — R2689 Other abnormalities of gait and mobility: Secondary | ICD-10-CM | POA: Diagnosis not present

## 2016-06-17 DIAGNOSIS — M545 Low back pain: Secondary | ICD-10-CM | POA: Diagnosis not present

## 2016-06-29 DIAGNOSIS — M6281 Muscle weakness (generalized): Secondary | ICD-10-CM | POA: Diagnosis not present

## 2016-06-29 DIAGNOSIS — R2689 Other abnormalities of gait and mobility: Secondary | ICD-10-CM | POA: Diagnosis not present

## 2016-06-29 DIAGNOSIS — M545 Low back pain: Secondary | ICD-10-CM | POA: Diagnosis not present

## 2016-07-02 DIAGNOSIS — M545 Low back pain: Secondary | ICD-10-CM | POA: Diagnosis not present

## 2016-07-02 DIAGNOSIS — R2689 Other abnormalities of gait and mobility: Secondary | ICD-10-CM | POA: Diagnosis not present

## 2016-07-02 DIAGNOSIS — M6281 Muscle weakness (generalized): Secondary | ICD-10-CM | POA: Diagnosis not present

## 2016-07-07 DIAGNOSIS — M545 Low back pain: Secondary | ICD-10-CM | POA: Diagnosis not present

## 2016-07-07 DIAGNOSIS — M6281 Muscle weakness (generalized): Secondary | ICD-10-CM | POA: Diagnosis not present

## 2016-07-07 DIAGNOSIS — R2689 Other abnormalities of gait and mobility: Secondary | ICD-10-CM | POA: Diagnosis not present

## 2016-07-10 DIAGNOSIS — M545 Low back pain: Secondary | ICD-10-CM | POA: Diagnosis not present

## 2016-07-10 DIAGNOSIS — R2689 Other abnormalities of gait and mobility: Secondary | ICD-10-CM | POA: Diagnosis not present

## 2016-07-10 DIAGNOSIS — M6281 Muscle weakness (generalized): Secondary | ICD-10-CM | POA: Diagnosis not present

## 2016-07-14 DIAGNOSIS — M6281 Muscle weakness (generalized): Secondary | ICD-10-CM | POA: Diagnosis not present

## 2016-07-14 DIAGNOSIS — M545 Low back pain: Secondary | ICD-10-CM | POA: Diagnosis not present

## 2016-07-14 DIAGNOSIS — R2689 Other abnormalities of gait and mobility: Secondary | ICD-10-CM | POA: Diagnosis not present

## 2016-07-17 DIAGNOSIS — M6281 Muscle weakness (generalized): Secondary | ICD-10-CM | POA: Diagnosis not present

## 2016-07-17 DIAGNOSIS — M545 Low back pain: Secondary | ICD-10-CM | POA: Diagnosis not present

## 2016-07-17 DIAGNOSIS — R2689 Other abnormalities of gait and mobility: Secondary | ICD-10-CM | POA: Diagnosis not present

## 2016-07-20 DIAGNOSIS — M6281 Muscle weakness (generalized): Secondary | ICD-10-CM | POA: Diagnosis not present

## 2016-07-20 DIAGNOSIS — M545 Low back pain: Secondary | ICD-10-CM | POA: Diagnosis not present

## 2016-07-20 DIAGNOSIS — R2689 Other abnormalities of gait and mobility: Secondary | ICD-10-CM | POA: Diagnosis not present

## 2016-07-27 DIAGNOSIS — M6281 Muscle weakness (generalized): Secondary | ICD-10-CM | POA: Diagnosis not present

## 2016-07-27 DIAGNOSIS — R2689 Other abnormalities of gait and mobility: Secondary | ICD-10-CM | POA: Diagnosis not present

## 2016-07-27 DIAGNOSIS — M545 Low back pain: Secondary | ICD-10-CM | POA: Diagnosis not present

## 2016-07-30 DIAGNOSIS — M6281 Muscle weakness (generalized): Secondary | ICD-10-CM | POA: Diagnosis not present

## 2016-07-30 DIAGNOSIS — M545 Low back pain: Secondary | ICD-10-CM | POA: Diagnosis not present

## 2016-07-30 DIAGNOSIS — R2689 Other abnormalities of gait and mobility: Secondary | ICD-10-CM | POA: Diagnosis not present

## 2016-08-05 DIAGNOSIS — M545 Low back pain: Secondary | ICD-10-CM | POA: Diagnosis not present

## 2016-08-05 DIAGNOSIS — M4316 Spondylolisthesis, lumbar region: Secondary | ICD-10-CM | POA: Diagnosis not present

## 2016-08-05 DIAGNOSIS — Z6832 Body mass index (BMI) 32.0-32.9, adult: Secondary | ICD-10-CM | POA: Diagnosis not present

## 2016-08-05 DIAGNOSIS — R030 Elevated blood-pressure reading, without diagnosis of hypertension: Secondary | ICD-10-CM | POA: Diagnosis not present

## 2016-08-05 DIAGNOSIS — M48062 Spinal stenosis, lumbar region with neurogenic claudication: Secondary | ICD-10-CM | POA: Diagnosis not present

## 2016-08-06 DIAGNOSIS — R2689 Other abnormalities of gait and mobility: Secondary | ICD-10-CM | POA: Diagnosis not present

## 2016-08-06 DIAGNOSIS — M545 Low back pain: Secondary | ICD-10-CM | POA: Diagnosis not present

## 2016-08-06 DIAGNOSIS — M6281 Muscle weakness (generalized): Secondary | ICD-10-CM | POA: Diagnosis not present

## 2016-08-07 DIAGNOSIS — M545 Low back pain: Secondary | ICD-10-CM | POA: Diagnosis not present

## 2016-08-07 DIAGNOSIS — R2689 Other abnormalities of gait and mobility: Secondary | ICD-10-CM | POA: Diagnosis not present

## 2016-08-07 DIAGNOSIS — M6281 Muscle weakness (generalized): Secondary | ICD-10-CM | POA: Diagnosis not present

## 2016-08-11 DIAGNOSIS — M545 Low back pain: Secondary | ICD-10-CM | POA: Diagnosis not present

## 2016-08-11 DIAGNOSIS — M6281 Muscle weakness (generalized): Secondary | ICD-10-CM | POA: Diagnosis not present

## 2016-08-11 DIAGNOSIS — R2689 Other abnormalities of gait and mobility: Secondary | ICD-10-CM | POA: Diagnosis not present

## 2016-08-13 DIAGNOSIS — R2689 Other abnormalities of gait and mobility: Secondary | ICD-10-CM | POA: Diagnosis not present

## 2016-08-13 DIAGNOSIS — M6281 Muscle weakness (generalized): Secondary | ICD-10-CM | POA: Diagnosis not present

## 2016-08-13 DIAGNOSIS — M545 Low back pain: Secondary | ICD-10-CM | POA: Diagnosis not present

## 2016-08-18 DIAGNOSIS — M6281 Muscle weakness (generalized): Secondary | ICD-10-CM | POA: Diagnosis not present

## 2016-08-18 DIAGNOSIS — M545 Low back pain: Secondary | ICD-10-CM | POA: Diagnosis not present

## 2016-08-18 DIAGNOSIS — R2689 Other abnormalities of gait and mobility: Secondary | ICD-10-CM | POA: Diagnosis not present

## 2016-08-25 DIAGNOSIS — R2689 Other abnormalities of gait and mobility: Secondary | ICD-10-CM | POA: Diagnosis not present

## 2016-08-25 DIAGNOSIS — M545 Low back pain: Secondary | ICD-10-CM | POA: Diagnosis not present

## 2016-08-25 DIAGNOSIS — M6281 Muscle weakness (generalized): Secondary | ICD-10-CM | POA: Diagnosis not present

## 2016-08-31 HISTORY — PX: OTHER SURGICAL HISTORY: SHX169

## 2016-09-04 DIAGNOSIS — Z23 Encounter for immunization: Secondary | ICD-10-CM | POA: Diagnosis not present

## 2016-11-26 DIAGNOSIS — D225 Melanocytic nevi of trunk: Secondary | ICD-10-CM | POA: Diagnosis not present

## 2016-11-26 DIAGNOSIS — L570 Actinic keratosis: Secondary | ICD-10-CM | POA: Diagnosis not present

## 2016-11-26 DIAGNOSIS — L578 Other skin changes due to chronic exposure to nonionizing radiation: Secondary | ICD-10-CM | POA: Diagnosis not present

## 2016-11-26 DIAGNOSIS — L820 Inflamed seborrheic keratosis: Secondary | ICD-10-CM | POA: Diagnosis not present

## 2016-11-26 DIAGNOSIS — D485 Neoplasm of uncertain behavior of skin: Secondary | ICD-10-CM | POA: Diagnosis not present

## 2016-11-26 DIAGNOSIS — L705 Acne excoriee des jeunes filles: Secondary | ICD-10-CM | POA: Diagnosis not present

## 2016-11-26 DIAGNOSIS — L720 Epidermal cyst: Secondary | ICD-10-CM | POA: Diagnosis not present

## 2016-12-22 DIAGNOSIS — H278 Other specified disorders of lens: Secondary | ICD-10-CM | POA: Diagnosis not present

## 2016-12-22 DIAGNOSIS — H2511 Age-related nuclear cataract, right eye: Secondary | ICD-10-CM | POA: Diagnosis not present

## 2017-01-05 DIAGNOSIS — M7541 Impingement syndrome of right shoulder: Secondary | ICD-10-CM | POA: Diagnosis not present

## 2017-01-07 DIAGNOSIS — X509XXA Other and unspecified overexertion or strenuous movements or postures, initial encounter: Secondary | ICD-10-CM | POA: Diagnosis not present

## 2017-01-07 DIAGNOSIS — M19011 Primary osteoarthritis, right shoulder: Secondary | ICD-10-CM | POA: Diagnosis not present

## 2017-01-07 DIAGNOSIS — M25511 Pain in right shoulder: Secondary | ICD-10-CM | POA: Diagnosis not present

## 2017-01-07 DIAGNOSIS — S46111A Strain of muscle, fascia and tendon of long head of biceps, right arm, initial encounter: Secondary | ICD-10-CM | POA: Diagnosis not present

## 2017-01-11 DIAGNOSIS — M66821 Spontaneous rupture of other tendons, right upper arm: Secondary | ICD-10-CM | POA: Diagnosis not present

## 2017-01-11 DIAGNOSIS — M7541 Impingement syndrome of right shoulder: Secondary | ICD-10-CM | POA: Diagnosis not present

## 2017-01-20 DIAGNOSIS — M66831 Spontaneous rupture of other tendons, right forearm: Secondary | ICD-10-CM | POA: Diagnosis not present

## 2017-01-20 DIAGNOSIS — M19011 Primary osteoarthritis, right shoulder: Secondary | ICD-10-CM | POA: Diagnosis not present

## 2017-01-20 DIAGNOSIS — M94211 Chondromalacia, right shoulder: Secondary | ICD-10-CM | POA: Diagnosis not present

## 2017-01-20 DIAGNOSIS — G8918 Other acute postprocedural pain: Secondary | ICD-10-CM | POA: Diagnosis not present

## 2017-01-20 DIAGNOSIS — M24111 Other articular cartilage disorders, right shoulder: Secondary | ICD-10-CM | POA: Diagnosis not present

## 2017-01-20 DIAGNOSIS — M7541 Impingement syndrome of right shoulder: Secondary | ICD-10-CM | POA: Diagnosis not present

## 2017-01-27 DIAGNOSIS — M6281 Muscle weakness (generalized): Secondary | ICD-10-CM | POA: Diagnosis not present

## 2017-01-27 DIAGNOSIS — M25511 Pain in right shoulder: Secondary | ICD-10-CM | POA: Diagnosis not present

## 2017-01-29 DIAGNOSIS — M25511 Pain in right shoulder: Secondary | ICD-10-CM | POA: Diagnosis not present

## 2017-01-29 DIAGNOSIS — M6281 Muscle weakness (generalized): Secondary | ICD-10-CM | POA: Diagnosis not present

## 2017-02-01 DIAGNOSIS — M6281 Muscle weakness (generalized): Secondary | ICD-10-CM | POA: Diagnosis not present

## 2017-02-01 DIAGNOSIS — M25511 Pain in right shoulder: Secondary | ICD-10-CM | POA: Diagnosis not present

## 2017-02-04 DIAGNOSIS — M6281 Muscle weakness (generalized): Secondary | ICD-10-CM | POA: Diagnosis not present

## 2017-02-04 DIAGNOSIS — M25511 Pain in right shoulder: Secondary | ICD-10-CM | POA: Diagnosis not present

## 2017-02-08 DIAGNOSIS — M6281 Muscle weakness (generalized): Secondary | ICD-10-CM | POA: Diagnosis not present

## 2017-02-08 DIAGNOSIS — M25511 Pain in right shoulder: Secondary | ICD-10-CM | POA: Diagnosis not present

## 2017-02-11 DIAGNOSIS — M6281 Muscle weakness (generalized): Secondary | ICD-10-CM | POA: Diagnosis not present

## 2017-02-11 DIAGNOSIS — M25511 Pain in right shoulder: Secondary | ICD-10-CM | POA: Diagnosis not present

## 2017-02-15 DIAGNOSIS — M25511 Pain in right shoulder: Secondary | ICD-10-CM | POA: Diagnosis not present

## 2017-02-15 DIAGNOSIS — M6281 Muscle weakness (generalized): Secondary | ICD-10-CM | POA: Diagnosis not present

## 2017-02-16 DIAGNOSIS — H26492 Other secondary cataract, left eye: Secondary | ICD-10-CM | POA: Diagnosis not present

## 2017-02-16 DIAGNOSIS — H40003 Preglaucoma, unspecified, bilateral: Secondary | ICD-10-CM | POA: Diagnosis not present

## 2017-02-18 DIAGNOSIS — M6281 Muscle weakness (generalized): Secondary | ICD-10-CM | POA: Diagnosis not present

## 2017-02-18 DIAGNOSIS — M25511 Pain in right shoulder: Secondary | ICD-10-CM | POA: Diagnosis not present

## 2017-02-22 DIAGNOSIS — M6281 Muscle weakness (generalized): Secondary | ICD-10-CM | POA: Diagnosis not present

## 2017-02-22 DIAGNOSIS — M25511 Pain in right shoulder: Secondary | ICD-10-CM | POA: Diagnosis not present

## 2017-02-25 DIAGNOSIS — M6281 Muscle weakness (generalized): Secondary | ICD-10-CM | POA: Diagnosis not present

## 2017-02-25 DIAGNOSIS — M25511 Pain in right shoulder: Secondary | ICD-10-CM | POA: Diagnosis not present

## 2017-03-15 DIAGNOSIS — M6281 Muscle weakness (generalized): Secondary | ICD-10-CM | POA: Diagnosis not present

## 2017-03-15 DIAGNOSIS — M25511 Pain in right shoulder: Secondary | ICD-10-CM | POA: Diagnosis not present

## 2017-03-18 DIAGNOSIS — M6281 Muscle weakness (generalized): Secondary | ICD-10-CM | POA: Diagnosis not present

## 2017-03-18 DIAGNOSIS — M25511 Pain in right shoulder: Secondary | ICD-10-CM | POA: Diagnosis not present

## 2017-03-23 DIAGNOSIS — M6281 Muscle weakness (generalized): Secondary | ICD-10-CM | POA: Diagnosis not present

## 2017-03-23 DIAGNOSIS — M25511 Pain in right shoulder: Secondary | ICD-10-CM | POA: Diagnosis not present

## 2017-03-29 DIAGNOSIS — K648 Other hemorrhoids: Secondary | ICD-10-CM | POA: Diagnosis not present

## 2017-03-29 DIAGNOSIS — K5730 Diverticulosis of large intestine without perforation or abscess without bleeding: Secondary | ICD-10-CM | POA: Diagnosis not present

## 2017-04-01 DIAGNOSIS — M25511 Pain in right shoulder: Secondary | ICD-10-CM | POA: Diagnosis not present

## 2017-04-01 DIAGNOSIS — M6281 Muscle weakness (generalized): Secondary | ICD-10-CM | POA: Diagnosis not present

## 2017-04-05 DIAGNOSIS — R42 Dizziness and giddiness: Secondary | ICD-10-CM | POA: Diagnosis not present

## 2017-04-05 DIAGNOSIS — Z6832 Body mass index (BMI) 32.0-32.9, adult: Secondary | ICD-10-CM | POA: Diagnosis not present

## 2017-04-05 DIAGNOSIS — I10 Essential (primary) hypertension: Secondary | ICD-10-CM | POA: Diagnosis not present

## 2017-04-08 IMAGING — MR MR LUMBAR SPINE WO/W CM
4 of 8 series · 16 of 48 positions shown · IV contrast (multihance)
Comparison: Lumbar MRI 03/08/2014

CLINICAL DATA: Lumbar radiculopathy.  Back surgery x2.

Creatinine was obtained on site at [HOSPITAL] at [HOSPITAL].
Results: Creatinine 1.2 mg/dL.
EXAM:
MRI LUMBAR SPINE WITHOUT AND WITH CONTRAST
TECHNIQUE: Multiplanar and multiecho pulse sequences of the lumbar spine were
obtained without and with intravenous contrast.
CONTRAST:  20mL MULTIHANCE GADOBENATE DIMEGLUMINE 529 MG/ML IV SOLN

[Series 7: T1 · sagittal · 4.0mm · 0.61mm/px · 3 of 15 slices shown (1 of 2)]
[im 1/15]
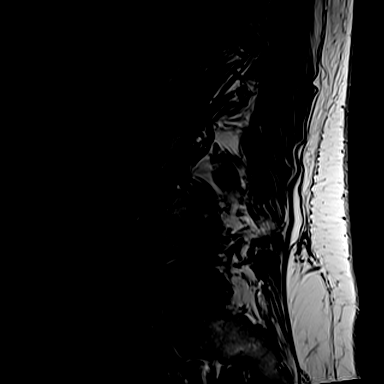
[im 8/15]
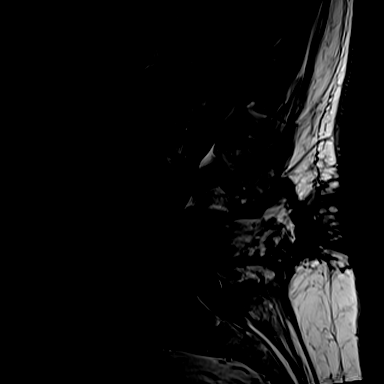
[im 15/15]
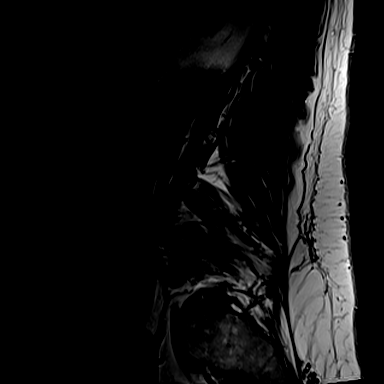

[Series 10: T1 · axial · 4.0mm · 0.28mm/px · z∈[-86,+96]mm · 3 of 37 slices shown (2 of 2)]
[im 5/37]
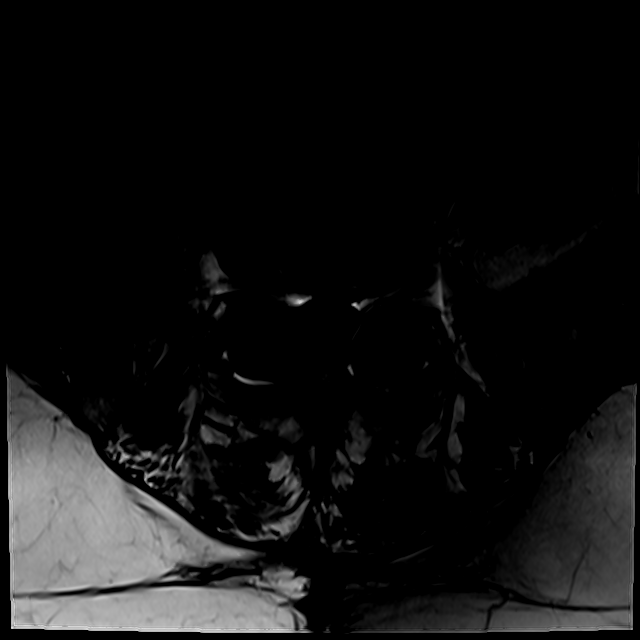
[im 21/37]
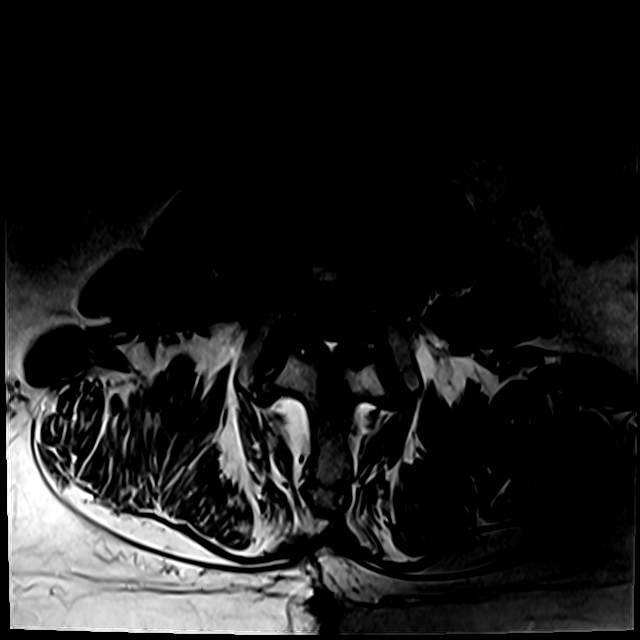
[im 33/37]
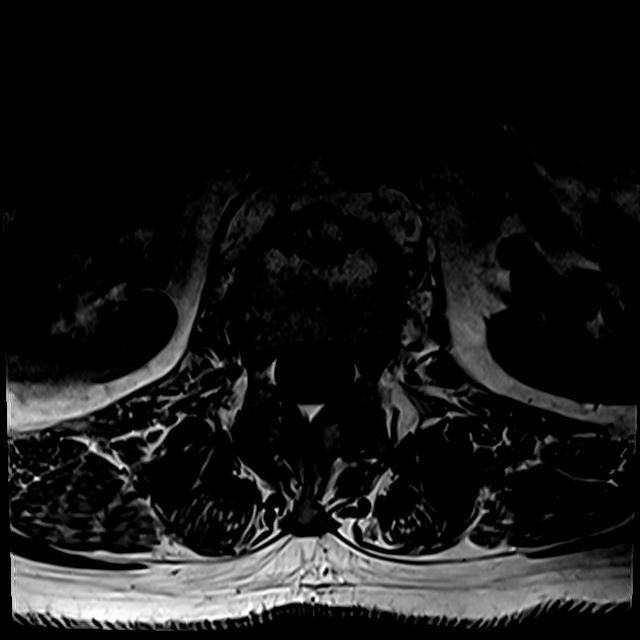

[Series 13: T2 · axial · 4.0mm · 0.28mm/px · z∈[-104,+96]mm · 7 of 37 slices shown (1 of 2)]
[im 1/37]
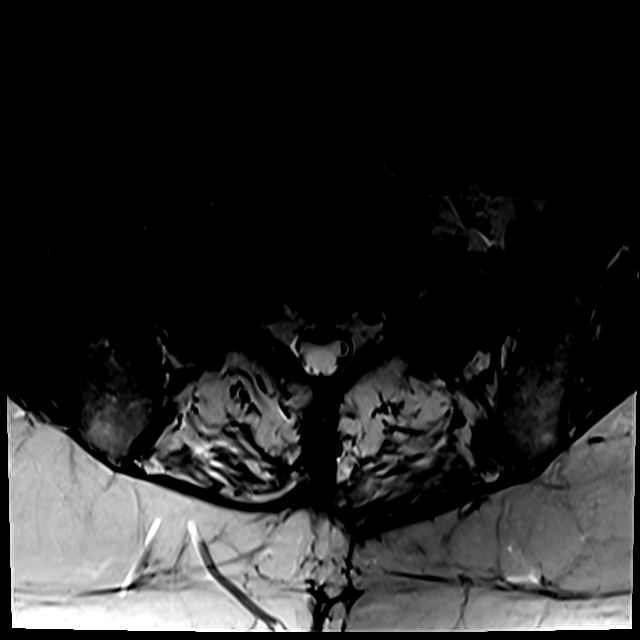
[im 5/37]
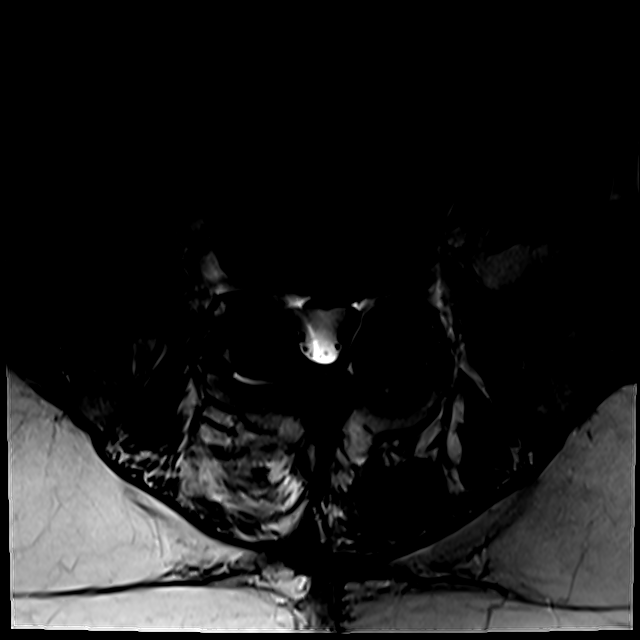
[im 13/37]
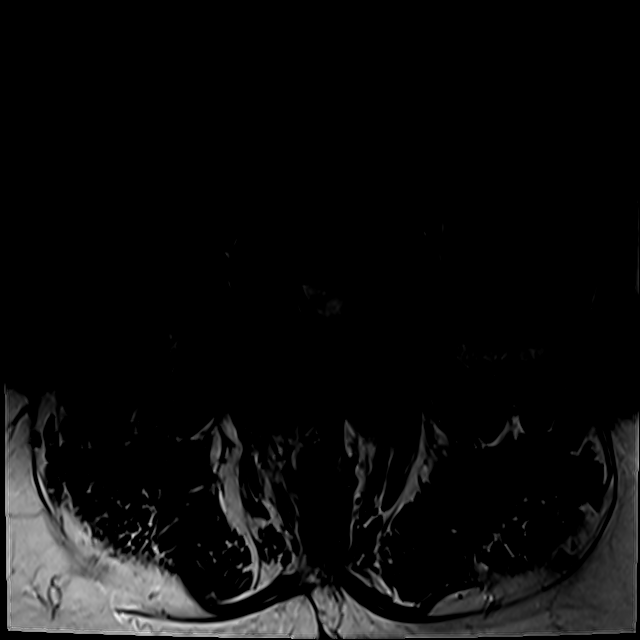
[im 17/37]
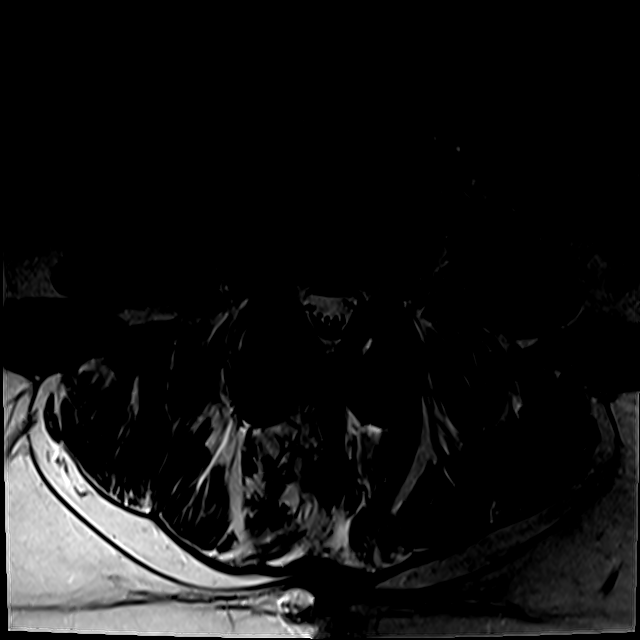
[im 21/37]
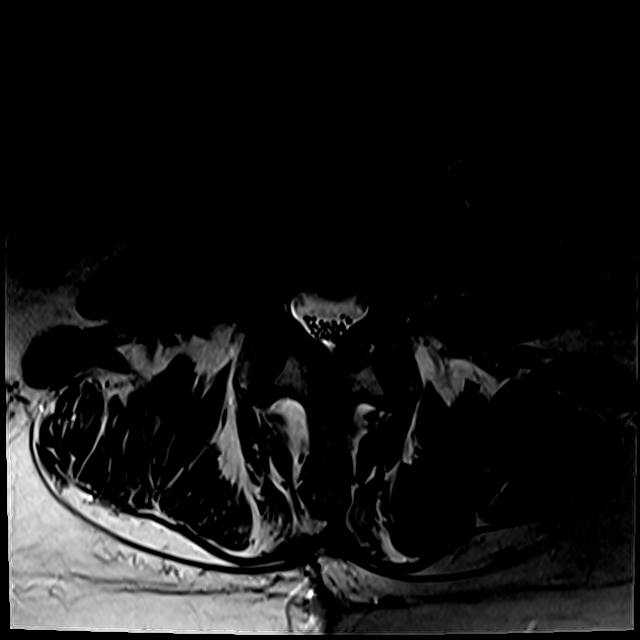
[im 25/37]
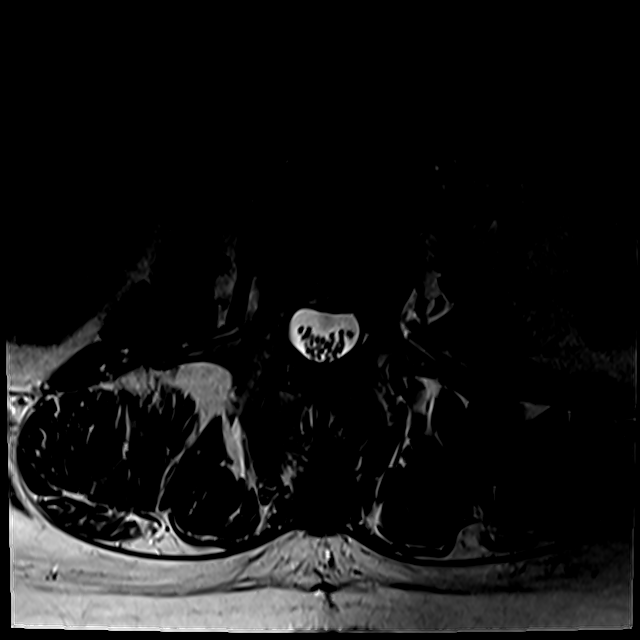
[im 33/37]
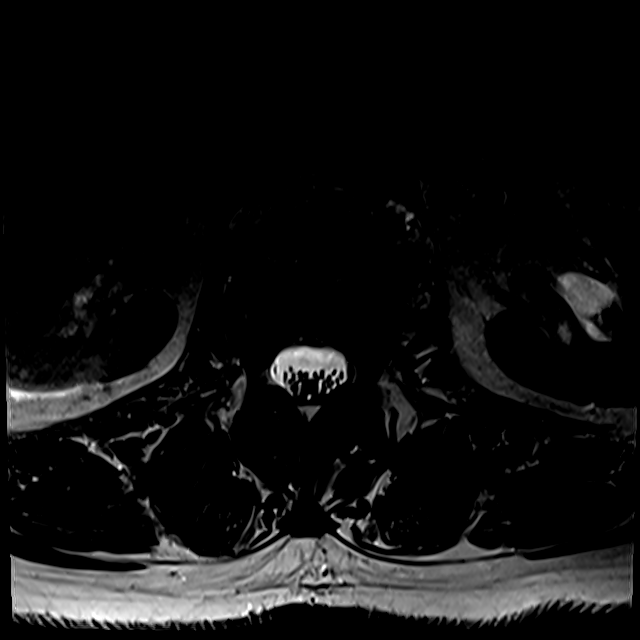

[Series 14: T2 · sagittal · 4.0mm · 0.61mm/px · 3 of 15 slices shown (2 of 2)]
[im 1/15]
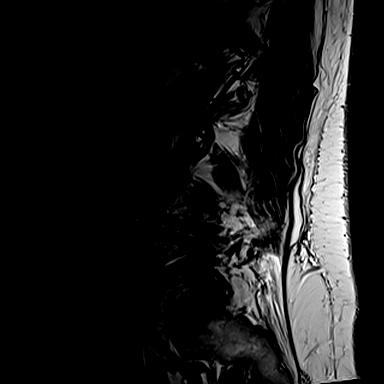
[im 10/15]
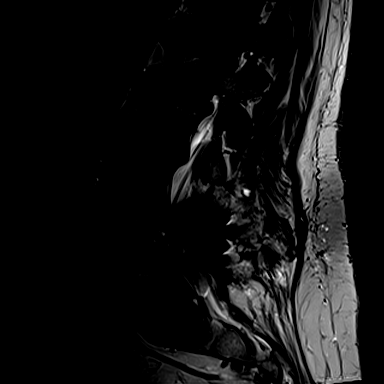
[im 15/15]
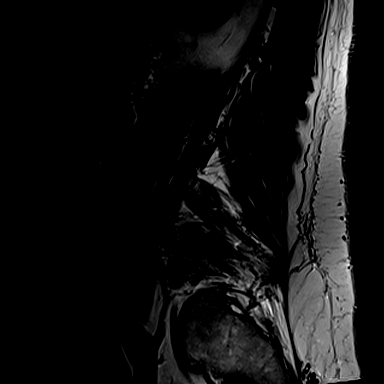

[16 of 48 positions shown; findings below may reference images not displayed]

FINDINGS: Segmentation:  Normal segmentation.  Lowest disc space L5-S1

Alignment: Mild retrolisthesis T12-L1, L1-2, L2-3, L3-4. 6 mm
anterior listhesis L4-5. Mild retrolisthesis L5-S1

Vertebrae: Pedicle screw and interbody fusion L4-5. Negative for
fracture or mass lesion. Expected enhancement following contrast
infusion without mass or fluid collection.

Conus medullaris: Extends to the L1-2 level and appears normal.

Paraspinal and other soft tissues: Paraspinous muscles are normal.
No retroperitoneal mass or fluid collection.

Disc levels:

T12-L1: Diffuse disc bulging without stenosis. Mild facet
degeneration

L1-2: Diffuse disc bulging and spurring. Schmorl's node. Mild spinal
stenosis. Mild foraminal stenosis bilaterally. Bilateral facet
hypertrophy.

L2-3: Diffuse disc bulging and endplate spurring. Bilateral facet
hypertrophy. Mild to moderate spinal stenosis. Mild foraminal
narrowing bilaterally

L3-4: Disc bulging and endplate spurring. Bilateral facet
hypertrophy. Moderate spinal stenosis and moderate foraminal
stenosis bilaterally

L4-5: Grade 1 anterior listhesis with pedicle screw and interbody
fusion. Posterior decompression. No significant spinal or foraminal
stenosis. Interval fusion since the prior study with resolution of
large synovial cyst.

L5-S1: Mild facet degeneration. No significant spinal or foraminal
stenosis.
IMPRESSION: Multilevel degenerative change throughout the lumbar spine as above.

Interval fusion at L4-5 with resolution of spinal stenosis and
synovial cyst.

Moderate spinal stenosis L3-4 unchanged. Mild to moderate stenosis
at L2-3 unchanged.

## 2017-04-29 DIAGNOSIS — Z0000 Encounter for general adult medical examination without abnormal findings: Secondary | ICD-10-CM | POA: Diagnosis not present

## 2017-04-29 DIAGNOSIS — I10 Essential (primary) hypertension: Secondary | ICD-10-CM | POA: Diagnosis not present

## 2017-04-29 DIAGNOSIS — Z1389 Encounter for screening for other disorder: Secondary | ICD-10-CM | POA: Diagnosis not present

## 2017-04-29 DIAGNOSIS — Z23 Encounter for immunization: Secondary | ICD-10-CM | POA: Diagnosis not present

## 2017-04-29 DIAGNOSIS — Z9181 History of falling: Secondary | ICD-10-CM | POA: Diagnosis not present

## 2017-05-04 DIAGNOSIS — K621 Rectal polyp: Secondary | ICD-10-CM | POA: Diagnosis not present

## 2017-05-04 DIAGNOSIS — Z1211 Encounter for screening for malignant neoplasm of colon: Secondary | ICD-10-CM | POA: Diagnosis not present

## 2017-05-04 DIAGNOSIS — Z79899 Other long term (current) drug therapy: Secondary | ICD-10-CM | POA: Diagnosis not present

## 2017-05-04 DIAGNOSIS — Z86010 Personal history of colonic polyps: Secondary | ICD-10-CM | POA: Diagnosis not present

## 2017-05-04 DIAGNOSIS — K5730 Diverticulosis of large intestine without perforation or abscess without bleeding: Secondary | ICD-10-CM | POA: Diagnosis not present

## 2017-05-04 DIAGNOSIS — Z800 Family history of malignant neoplasm of digestive organs: Secondary | ICD-10-CM | POA: Diagnosis not present

## 2017-05-04 DIAGNOSIS — D128 Benign neoplasm of rectum: Secondary | ICD-10-CM | POA: Diagnosis not present

## 2017-05-04 DIAGNOSIS — K648 Other hemorrhoids: Secondary | ICD-10-CM | POA: Diagnosis not present

## 2017-05-04 DIAGNOSIS — Z87891 Personal history of nicotine dependence: Secondary | ICD-10-CM | POA: Diagnosis not present

## 2017-05-04 DIAGNOSIS — M1990 Unspecified osteoarthritis, unspecified site: Secondary | ICD-10-CM | POA: Diagnosis not present

## 2017-06-01 DIAGNOSIS — L570 Actinic keratosis: Secondary | ICD-10-CM | POA: Diagnosis not present

## 2017-06-01 DIAGNOSIS — L720 Epidermal cyst: Secondary | ICD-10-CM | POA: Diagnosis not present

## 2017-06-01 DIAGNOSIS — L578 Other skin changes due to chronic exposure to nonionizing radiation: Secondary | ICD-10-CM | POA: Diagnosis not present

## 2017-08-17 DIAGNOSIS — Z23 Encounter for immunization: Secondary | ICD-10-CM | POA: Diagnosis not present

## 2017-11-22 DIAGNOSIS — R030 Elevated blood-pressure reading, without diagnosis of hypertension: Secondary | ICD-10-CM | POA: Diagnosis not present

## 2017-11-22 DIAGNOSIS — Z6831 Body mass index (BMI) 31.0-31.9, adult: Secondary | ICD-10-CM | POA: Diagnosis not present

## 2017-11-22 DIAGNOSIS — M545 Low back pain: Secondary | ICD-10-CM | POA: Diagnosis not present

## 2017-11-22 DIAGNOSIS — M5416 Radiculopathy, lumbar region: Secondary | ICD-10-CM | POA: Diagnosis not present

## 2017-11-24 DIAGNOSIS — I10 Essential (primary) hypertension: Secondary | ICD-10-CM | POA: Diagnosis not present

## 2017-11-24 DIAGNOSIS — Z6831 Body mass index (BMI) 31.0-31.9, adult: Secondary | ICD-10-CM | POA: Diagnosis not present

## 2018-02-21 DIAGNOSIS — M25561 Pain in right knee: Secondary | ICD-10-CM | POA: Diagnosis not present

## 2018-02-21 DIAGNOSIS — G8929 Other chronic pain: Secondary | ICD-10-CM | POA: Diagnosis not present

## 2018-03-16 DIAGNOSIS — I10 Essential (primary) hypertension: Secondary | ICD-10-CM | POA: Diagnosis not present

## 2018-03-16 DIAGNOSIS — R5383 Other fatigue: Secondary | ICD-10-CM | POA: Diagnosis not present

## 2018-03-16 DIAGNOSIS — Z79899 Other long term (current) drug therapy: Secondary | ICD-10-CM | POA: Diagnosis not present

## 2018-03-16 DIAGNOSIS — Z125 Encounter for screening for malignant neoplasm of prostate: Secondary | ICD-10-CM | POA: Diagnosis not present

## 2018-03-16 DIAGNOSIS — E785 Hyperlipidemia, unspecified: Secondary | ICD-10-CM | POA: Diagnosis not present

## 2018-03-24 DIAGNOSIS — H25811 Combined forms of age-related cataract, right eye: Secondary | ICD-10-CM | POA: Diagnosis not present

## 2018-03-24 DIAGNOSIS — Z961 Presence of intraocular lens: Secondary | ICD-10-CM | POA: Diagnosis not present

## 2018-03-28 ENCOUNTER — Other Ambulatory Visit: Payer: Self-pay | Admitting: Orthopedic Surgery

## 2018-05-09 NOTE — Patient Instructions (Signed)
Jonathon Gonzales  05/09/2018   Your procedure is scheduled on: 05-23-18   Report to [facility] Main  Entrance    Report to admitting at 7:15AM    Call this number if you have problems the morning of surgery [phone]     Remember: Do not eat food or drink liquids :After Midnight.     Take these medicines the morning of surgery with A SIP OF WATER: amlodipine, omeprazole, tylenol if needed                                 You may not have any metal on your body including hair pins and              piercings  Do not wear jewelry, make-up, lotions, powders or perfumes, deodorant                 Men may shave face and neck.   Do not bring valuables to the hospital. [place].  Contacts, dentures or bridgework may not be worn into surgery.  Leave suitcase in the car. After surgery it may be brought to your room.                Please read over the following fact sheets you were given: _____________________________________________________________________             [facility] - Preparing for Surgery Before surgery, you can play an important role.  Because skin is not sterile, your skin needs to be as free of germs as possible.  You can reduce the number of germs on your skin by washing with CHG (chlorahexidine gluconate) soap before surgery.  CHG is an antiseptic cleaner which kills germs and bonds with the skin to continue killing germs even after washing. Please DO NOT use if you have an allergy to CHG or antibacterial soaps.  If your skin becomes reddened/irritated stop using the CHG and inform your nurse when you arrive at Short Stay. Do not shave (including legs and underarms) for at least 48 hours prior to the first CHG shower.  You may shave your face/neck. Please follow these instructions carefully:  1.  Shower with CHG Soap the night before surgery and the  morning of Surgery.  2.  If you choose to  wash your hair, wash your hair first as usual with your  normal  shampoo.  3.  After you shampoo, rinse your hair and body thoroughly to remove the  shampoo.                           4.  Use CHG as you would any other liquid soap.  You can apply chg directly  to the skin and wash                       Gently with a scrungie or clean washcloth.  5.  Apply the CHG Soap to your body ONLY FROM THE NECK DOWN.   Do not use on face/ open                           Wound  or open sores. Avoid contact with eyes, ears mouth and genitals (private parts).                       Wash face,  Genitals (private parts) with your normal soap.             6.  Wash thoroughly, paying special attention to the area where your surgery  will be performed.  7.  Thoroughly rinse your body with warm water from the neck down.  8.  DO NOT shower/wash with your normal soap after using and rinsing off  the CHG Soap.                9.  Pat yourself dry with a clean towel.            10.  Wear clean pajamas.            11.  Place clean sheets on your bed the night of your first shower and do not  sleep with pets. Day of Surgery : Do not apply any lotions/deodorants the morning of surgery.  Please wear clean clothes to the hospital/surgery center.  FAILURE TO FOLLOW THESE INSTRUCTIONS MAY RESULT IN THE CANCELLATION OF YOUR SURGERY PATIENT SIGNATURE_________________________________  NURSE SIGNATURE__________________________________  ________________________________________________________________________   Jonathon Gonzales  An incentive spirometer is a tool that can help keep your lungs clear and active. This tool measures how well you are filling your lungs with each breath. Taking long deep breaths may help reverse or decrease the chance of developing breathing (pulmonary) problems (especially infection) following:  A long period of time when you are unable to move or be active. BEFORE THE PROCEDURE   If the  spirometer includes an indicator to show your best effort, your nurse or respiratory therapist will set it to a desired goal.  If possible, sit up straight or lean slightly forward. Try not to slouch.  Hold the incentive spirometer in an upright position. INSTRUCTIONS FOR USE  1. Sit on the edge of your bed if possible, or sit up as far as you can in bed or on a chair. 2. Hold the incentive spirometer in an upright position. 3. Breathe out normally. 4. Place the mouthpiece in your mouth and seal your lips tightly around it. 5. Breathe in slowly and as deeply as possible, raising the piston or the ball toward the top of the column. 6. Hold your breath for 3-5 seconds or for as long as possible. Allow the piston or ball to fall to the bottom of the column. 7. Remove the mouthpiece from your mouth and breathe out normally. 8. Rest for a few seconds and repeat Steps 1 through 7 at least 10 times every 1-2 hours when you are awake. Take your time and take a few normal breaths between deep breaths. 9. The spirometer may include an indicator to show your best effort. Use the indicator as a goal to work toward during each repetition. 10. After each set of 10 deep breaths, practice coughing to be sure your lungs are clear. If you have an incision (the cut made at the time of surgery), support your incision when coughing by placing a pillow or rolled up towels firmly against it. Once you are able to get out of bed, walk around indoors and cough well. You may stop using the incentive spirometer when instructed by your caregiver.  RISKS AND COMPLICATIONS  Take your time so you do not get dizzy  or light-headed.  If you are in pain, you may need to take or ask for pain medication before doing incentive spirometry. It is harder to take a deep breath if you are having pain. AFTER USE  Rest and breathe slowly and easily.  It can be helpful to keep track of a log of your progress. Your caregiver can provide  you with a simple table to help with this. If you are using the spirometer at home, follow these instructions: [place] IF:   You are having difficultly using the spirometer.  You have trouble using the spirometer as often as instructed.  Your pain medication is not giving enough relief while using the spirometer.  You develop fever of 100.5 F (38.1 C) or higher. SEEK IMMEDIATE MEDICAL CARE IF:   You cough up bloody sputum that had not been present before.  You develop fever of 102 F (38.9 C) or greater.  You develop worsening pain at or near the incision site. MAKE SURE YOU:   Understand these instructions.  Will watch your condition.  Will get help right away if you are not doing well or get worse. Document Released: 12/28/2006 Document Revised: 11/09/2011 Document Reviewed: 02/28/2007 [facility] Patient Information 2014 [place], [place].   ________________________________________________________________________

## 2018-05-10 ENCOUNTER — Encounter (HOSPITAL_COMMUNITY): Payer: Self-pay

## 2018-05-10 ENCOUNTER — Encounter (HOSPITAL_COMMUNITY)
Admission: RE | Admit: 2018-05-10 | Discharge: 2018-05-10 | Disposition: A | Discharge status: Home / Self Care | Payer: Medicare Other | Source: Ambulatory Visit | Attending: Orthopedic Surgery | Admitting: Orthopedic Surgery

## 2018-05-10 ENCOUNTER — Other Ambulatory Visit: Payer: Self-pay

## 2018-05-10 DIAGNOSIS — Z01812 Encounter for preprocedural laboratory examination: Secondary | ICD-10-CM | POA: Insufficient documentation

## 2018-05-10 DIAGNOSIS — M1711 Unilateral primary osteoarthritis, right knee: Secondary | ICD-10-CM | POA: Diagnosis not present

## 2018-05-10 HISTORY — DX: Nausea with vomiting, unspecified: R11.2

## 2018-05-10 HISTORY — DX: Anesthesia of skin: R20.0

## 2018-05-10 HISTORY — DX: Essential (primary) hypertension: I10

## 2018-05-10 HISTORY — DX: Gastro-esophageal reflux disease without esophagitis: K21.9

## 2018-05-10 HISTORY — DX: Other specified postprocedural states: Z98.890

## 2018-05-10 LAB — COMPREHENSIVE METABOLIC PANEL
ALT: 20 U/L (ref 0–44)
ANION GAP: 8 (ref 5–15)
AST: 20 U/L (ref 15–41)
Albumin: 4.2 g/dL (ref 3.5–5.0)
Alkaline Phosphatase: 55 U/L (ref 38–126)
BUN: 19 mg/dL (ref 8–23)
CHLORIDE: 106 mmol/L (ref 98–111)
CO2: 26 mmol/L (ref 22–32)
Calcium: 9.5 mg/dL (ref 8.9–10.3)
Creatinine, Ser: 0.9 mg/dL (ref 0.61–1.24)
GFR calc non Af Amer: >60 mL/min (ref >60)
Glucose, Bld: 95 mg/dL (ref 70–99)
POTASSIUM: 4.6 mmol/L (ref 3.5–5.1)
SODIUM: 140 mmol/L (ref 135–145)
Total Bilirubin: 1 mg/dL (ref 0.3–1.2)
Total Protein: 7 g/dL (ref 6.5–8.1)

## 2018-05-10 LAB — SURGICAL PCR SCREEN
MRSA, PCR: NEGATIVE
STAPHYLOCOCCUS AUREUS: NEGATIVE

## 2018-05-10 LAB — CBC WITH DIFFERENTIAL/PLATELET
BASOS PCT: 0 %
Basophils Absolute: 0 10*3/uL (ref 0.0–0.1)
EOS ABS: 0.2 10*3/uL (ref 0.0–0.7)
EOS PCT: 4 %
HCT: 46.2 % (ref 39.0–52.0)
Hemoglobin: 15.5 g/dL (ref 13.0–17.0)
LYMPHS ABS: 1.4 10*3/uL (ref 0.7–4.0)
Lymphocytes Relative: 29 %
MCH: 31.1 pg (ref 26.0–34.0)
MCHC: 33.5 g/dL (ref 30.0–36.0)
MCV: 92.6 fL (ref 78.0–100.0)
MONOS PCT: 12 %
Monocytes Absolute: 0.6 10*3/uL (ref 0.1–1.0)
Neutro Abs: 2.6 10*3/uL (ref 1.7–7.7)
Neutrophils Relative %: 55 %
PLATELETS: 173 10*3/uL (ref 150–400)
RBC: 4.99 MIL/uL (ref 4.22–5.81)
RDW: 13.3 % (ref 11.5–15.5)
WBC: 4.8 10*3/uL (ref 4.0–10.5)

## 2018-05-10 NOTE — Progress Notes (Signed)
EKG 03-16-18 on chart from [facility], [place]

## 2018-05-13 DIAGNOSIS — M1711 Unilateral primary osteoarthritis, right knee: Secondary | ICD-10-CM | POA: Diagnosis not present

## 2018-05-22 MED ORDER — BUPIVACAINE LIPOSOME 1.3 % IJ SUSP
20.0000 mL | INTRAMUSCULAR | Status: DC
  Filled 2018-05-22: qty 20

## 2018-05-22 NOTE — Anesthesia Preprocedure Evaluation (Addendum)
Anesthesia Evaluation  Patient identified by MRN, date of birth, ID band Patient awake    Reviewed: Allergy & Precautions, H&P , NPO status , Patient's Chart, lab work & pertinent test results, reviewed documented beta blocker date and time   History of Anesthesia Complications (+) PONV and history of anesthetic complications  Airway Mallampati: II  TM Distance: >3 FB Neck ROM: Full    Dental no notable dental hx. (+) Teeth Intact, Dental Advisory Given, Partial Upper   Pulmonary former smoker,    Pulmonary exam normal breath sounds clear to auscultation       Cardiovascular Exercise Tolerance: Good hypertension, Pt. on medications  Rhythm:Regular Rate:Normal     Neuro/Psych negative neurological ROS  negative psych ROS   GI/Hepatic Neg liver ROS, GERD  Medicated,  Endo/Other  negative endocrine ROS  Renal/GU negative Renal ROS  negative genitourinary   Musculoskeletal  (+) Arthritis , Osteoarthritis,    Abdominal (+)  Abdomen: soft.    Peds  Hematology negative hematology ROS (+)   Anesthesia Other Findings MRI LUMBAR SPINE 15' Alignment: Mild retrolisthesis T12-L1, L1-2, L2-3, L3-4. 6 mm anterior listhesis L4-5. Mild retrolisthesis L5-S1  Vertebrae: Pedicle screw and interbody fusion L4-5. Conus medullaris: Extends to the L1-2 level and appears normal.  Go L1--3  Reproductive/Obstetrics negative OB ROS                            Anesthesia Physical  Anesthesia Plan  ASA: II  Anesthesia Plan: General   Post-op Pain Management:  Regional for Post-op pain   Induction: Intravenous  PONV Risk Score and Plan: 2 and Treatment may vary due to age or medical condition, Ondansetron and Dexamethasone  Airway Management Planned: LMA  Additional Equipment:   Intra-op Plan:   Post-operative Plan:   Informed Consent: I have reviewed the patients History and Physical, chart,  labs and discussed the procedure including the risks, benefits and alternatives for the proposed anesthesia with the patient or authorized representative who has indicated his/her understanding and acceptance.   Dental advisory given  Plan Discussed with: CRNA, Anesthesiologist and Surgeon  Anesthesia Plan Comments:        Anesthesia Quick Evaluation

## 2018-05-23 ENCOUNTER — Ambulatory Visit (HOSPITAL_COMMUNITY): Payer: Medicare Other | Admitting: Anesthesiology

## 2018-05-23 ENCOUNTER — Encounter (HOSPITAL_COMMUNITY)
Admission: RE | Disposition: A | Discharge status: Home / Self Care | Payer: Self-pay | Source: Ambulatory Visit | Attending: Orthopedic Surgery

## 2018-05-23 ENCOUNTER — Encounter (HOSPITAL_COMMUNITY): Payer: Self-pay

## 2018-05-23 ENCOUNTER — Other Ambulatory Visit: Payer: Self-pay

## 2018-05-23 ENCOUNTER — Observation Stay (HOSPITAL_COMMUNITY)
Admission: RE | Admit: 2018-05-23 | Discharge: 2018-05-24 | Disposition: A | Discharge status: Home / Self Care | Payer: Medicare Other | Source: Ambulatory Visit | Attending: Orthopedic Surgery | Admitting: Orthopedic Surgery

## 2018-05-23 DIAGNOSIS — Z79899 Other long term (current) drug therapy: Secondary | ICD-10-CM | POA: Insufficient documentation

## 2018-05-23 DIAGNOSIS — Z87891 Personal history of nicotine dependence: Secondary | ICD-10-CM | POA: Diagnosis not present

## 2018-05-23 DIAGNOSIS — G8918 Other acute postprocedural pain: Secondary | ICD-10-CM | POA: Diagnosis not present

## 2018-05-23 DIAGNOSIS — M1711 Unilateral primary osteoarthritis, right knee: Principal | ICD-10-CM | POA: Insufficient documentation

## 2018-05-23 DIAGNOSIS — Z981 Arthrodesis status: Secondary | ICD-10-CM | POA: Insufficient documentation

## 2018-05-23 DIAGNOSIS — I10 Essential (primary) hypertension: Secondary | ICD-10-CM | POA: Diagnosis not present

## 2018-05-23 DIAGNOSIS — K219 Gastro-esophageal reflux disease without esophagitis: Secondary | ICD-10-CM | POA: Insufficient documentation

## 2018-05-23 DIAGNOSIS — Z96659 Presence of unspecified artificial knee joint: Secondary | ICD-10-CM

## 2018-05-23 HISTORY — PX: TOTAL KNEE ARTHROPLASTY: SHX125

## 2018-05-23 SURGERY — ARTHROPLASTY, KNEE, TOTAL
Anesthesia: General | Site: Knee | Laterality: Right

## 2018-05-23 MED ORDER — OXYCODONE HCL 5 MG/5ML PO SOLN
5.0000 mg | Freq: Once | ORAL | Status: DC | PRN
  Filled 2018-05-23: qty 5

## 2018-05-23 MED ORDER — METOCLOPRAMIDE HCL 5 MG PO TABS
5.0000 mg | ORAL_TABLET | Freq: Three times a day (TID) | ORAL | Status: DC | PRN
  Administered 2018-05-23: 10 mg via ORAL
  Filled 2018-05-23: qty 2

## 2018-05-23 MED ORDER — PHENYLEPHRINE 40 MCG/ML (10ML) SYRINGE FOR IV PUSH (FOR BLOOD PRESSURE SUPPORT)
PREFILLED_SYRINGE | INTRAVENOUS | Status: DC | PRN
  Administered 2018-05-23 (×4): 80 ug via INTRAVENOUS

## 2018-05-23 MED ORDER — BUPIVACAINE-EPINEPHRINE (PF) 0.25% -1:200000 IJ SOLN
INTRAMUSCULAR | Status: DC | PRN
  Administered 2018-05-23: 30 mL

## 2018-05-23 MED ORDER — PANTOPRAZOLE SODIUM 40 MG PO TBEC: 40.0000 mg | DELAYED_RELEASE_TABLET | Freq: Every day | ORAL | Status: DC

## 2018-05-23 MED ORDER — CHLORHEXIDINE GLUCONATE 4 % EX LIQD: 60.0000 mL | Freq: Once | CUTANEOUS | Status: DC

## 2018-05-23 MED ORDER — TRANEXAMIC ACID 1000 MG/10ML IV SOLN
1000.0000 mg | Freq: Once | INTRAVENOUS | Status: AC
  Administered 2018-05-23: 1000 mg via INTRAVENOUS
  Filled 2018-05-23: qty 1000

## 2018-05-23 MED ORDER — FERROUS SULFATE 325 (65 FE) MG PO TABS
325.0000 mg | ORAL_TABLET | Freq: Three times a day (TID) | ORAL | Status: DC
  Administered 2018-05-24 (×2): 325 mg via ORAL
  Filled 2018-05-23 (×2): qty 1

## 2018-05-23 MED ORDER — GABAPENTIN 300 MG PO CAPS
300.0000 mg | ORAL_CAPSULE | Freq: Three times a day (TID) | ORAL | Status: DC
  Administered 2018-05-23 – 2018-05-24 (×3): 300 mg via ORAL
  Filled 2018-05-23 (×3): qty 1

## 2018-05-23 MED ORDER — GABAPENTIN 300 MG PO CAPS
300.0000 mg | ORAL_CAPSULE | Freq: Once | ORAL | Status: AC
  Administered 2018-05-23: 300 mg via ORAL
  Filled 2018-05-23: qty 1

## 2018-05-23 MED ORDER — SODIUM CHLORIDE 0.9 % IJ SOLN
INTRAMUSCULAR | Status: AC
  Filled 2018-05-23: qty 50

## 2018-05-23 MED ORDER — ACETAMINOPHEN 160 MG/5ML PO SOLN: 325.0000 mg | ORAL | Status: DC | PRN

## 2018-05-23 MED ORDER — PHENOL 1.4 % MT LIQD: 1.0000 | OROMUCOSAL | Status: DC | PRN

## 2018-05-23 MED ORDER — FENTANYL CITRATE (PF) 100 MCG/2ML IJ SOLN
INTRAMUSCULAR | Status: AC
  Filled 2018-05-23: qty 2

## 2018-05-23 MED ORDER — SODIUM CHLORIDE 0.9 % IV SOLN
INTRAVENOUS | Status: DC
  Administered 2018-05-23: 19:00:00 via INTRAVENOUS

## 2018-05-23 MED ORDER — DIPHENHYDRAMINE HCL 12.5 MG/5ML PO ELIX: 12.5000 mg | ORAL_SOLUTION | ORAL | Status: DC | PRN

## 2018-05-23 MED ORDER — OXYCODONE HCL 5 MG PO TABS: 5.0000 mg | ORAL_TABLET | Freq: Once | ORAL | Status: DC | PRN

## 2018-05-23 MED ORDER — SUCCINYLCHOLINE CHLORIDE 200 MG/10ML IV SOSY
PREFILLED_SYRINGE | INTRAVENOUS | Status: AC
  Filled 2018-05-23: qty 10

## 2018-05-23 MED ORDER — ALUM & MAG HYDROXIDE-SIMETH 200-200-20 MG/5ML PO SUSP
30.0000 mL | ORAL | Status: DC | PRN
  Administered 2018-05-23: 30 mL via ORAL
  Filled 2018-05-23: qty 30

## 2018-05-23 MED ORDER — ASPIRIN EC 325 MG PO TBEC
325.0000 mg | DELAYED_RELEASE_TABLET | Freq: Two times a day (BID) | ORAL | Status: DC
  Administered 2018-05-24: 325 mg via ORAL
  Filled 2018-05-23: qty 1

## 2018-05-23 MED ORDER — ONDANSETRON HCL 4 MG PO TABS: 4.0000 mg | ORAL_TABLET | Freq: Four times a day (QID) | ORAL | Status: DC | PRN

## 2018-05-23 MED ORDER — ZOLPIDEM TARTRATE 5 MG PO TABS: 5.0000 mg | ORAL_TABLET | Freq: Every evening | ORAL | Status: DC | PRN

## 2018-05-23 MED ORDER — CEFAZOLIN SODIUM-DEXTROSE 2-4 GM/100ML-% IV SOLN
2.0000 g | INTRAVENOUS | Status: AC
  Administered 2018-05-23: 2 g via INTRAVENOUS
  Filled 2018-05-23: qty 100

## 2018-05-23 MED ORDER — PANTOPRAZOLE SODIUM 40 MG PO TBEC
40.0000 mg | DELAYED_RELEASE_TABLET | Freq: Every day | ORAL | Status: DC
  Administered 2018-05-24: 40 mg via ORAL
  Filled 2018-05-23: qty 1

## 2018-05-23 MED ORDER — HYDROMORPHONE HCL 1 MG/ML IJ SOLN
INTRAMUSCULAR | Status: AC
  Filled 2018-05-23: qty 1

## 2018-05-23 MED ORDER — BUPIVACAINE-EPINEPHRINE (PF) 0.25% -1:200000 IJ SOLN
INTRAMUSCULAR | Status: AC
  Filled 2018-05-23: qty 30

## 2018-05-23 MED ORDER — BISACODYL 5 MG PO TBEC: 5.0000 mg | DELAYED_RELEASE_TABLET | Freq: Every day | ORAL | Status: DC | PRN

## 2018-05-23 MED ORDER — ONDANSETRON HCL 4 MG/2ML IJ SOLN
INTRAMUSCULAR | Status: DC | PRN
  Administered 2018-05-23: 4 mg via INTRAVENOUS

## 2018-05-23 MED ORDER — SODIUM CHLORIDE 0.9 % IR SOLN
Status: DC | PRN
  Administered 2018-05-23: 2000 mL

## 2018-05-23 MED ORDER — ACETAMINOPHEN 500 MG PO TABS
1000.0000 mg | ORAL_TABLET | Freq: Four times a day (QID) | ORAL | Status: AC
  Administered 2018-05-23 – 2018-05-24 (×4): 1000 mg via ORAL
  Filled 2018-05-23 (×4): qty 2

## 2018-05-23 MED ORDER — ONDANSETRON HCL 4 MG/2ML IJ SOLN
4.0000 mg | Freq: Four times a day (QID) | INTRAMUSCULAR | Status: DC | PRN
  Administered 2018-05-23: 4 mg via INTRAVENOUS
  Filled 2018-05-23: qty 2

## 2018-05-23 MED ORDER — LIDOCAINE 2% (20 MG/ML) 5 ML SYRINGE
INTRAMUSCULAR | Status: DC | PRN
  Administered 2018-05-23: 100 mg via INTRAVENOUS

## 2018-05-23 MED ORDER — SENNOSIDES-DOCUSATE SODIUM 8.6-50 MG PO TABS: 1.0000 | ORAL_TABLET | Freq: Every evening | ORAL | Status: DC | PRN

## 2018-05-23 MED ORDER — MEPERIDINE HCL 50 MG/ML IJ SOLN: 6.2500 mg | INTRAMUSCULAR | Status: DC | PRN

## 2018-05-23 MED ORDER — ACETAMINOPHEN 325 MG PO TABS: 325.0000 mg | ORAL_TABLET | ORAL | Status: DC | PRN

## 2018-05-23 MED ORDER — FENTANYL CITRATE (PF) 100 MCG/2ML IJ SOLN
25.0000 ug | INTRAMUSCULAR | Status: DC | PRN
  Administered 2018-05-23 (×2): 50 ug via INTRAVENOUS

## 2018-05-23 MED ORDER — HYDROMORPHONE HCL 1 MG/ML IJ SOLN
0.2500 mg | INTRAMUSCULAR | Status: DC | PRN
  Administered 2018-05-23 (×2): 0.5 mg via INTRAVENOUS

## 2018-05-23 MED ORDER — METHOCARBAMOL 500 MG IVPB - SIMPLE MED
INTRAVENOUS | Status: AC
  Filled 2018-05-23: qty 50

## 2018-05-23 MED ORDER — SODIUM CHLORIDE 0.9 % IR SOLN
Status: DC | PRN
  Administered 2018-05-23: 1000 mL

## 2018-05-23 MED ORDER — PROPOFOL 10 MG/ML IV BOLUS
INTRAVENOUS | Status: DC | PRN
  Administered 2018-05-23: 20 mg via INTRAVENOUS
  Administered 2018-05-23: 200 mg via INTRAVENOUS
  Administered 2018-05-23 (×2): 20 mg via INTRAVENOUS

## 2018-05-23 MED ORDER — MIDAZOLAM HCL 2 MG/2ML IJ SOLN
1.0000 mg | Freq: Once | INTRAMUSCULAR | Status: AC
  Administered 2018-05-23: 2 mg via INTRAVENOUS
  Filled 2018-05-23: qty 2

## 2018-05-23 MED ORDER — TRAMADOL HCL 50 MG PO TABS
50.0000 mg | ORAL_TABLET | Freq: Four times a day (QID) | ORAL | Status: DC
  Administered 2018-05-23 – 2018-05-24 (×5): 50 mg via ORAL
  Filled 2018-05-23 (×5): qty 1

## 2018-05-23 MED ORDER — STERILE WATER FOR IRRIGATION IR SOLN
Status: DC | PRN
  Administered 2018-05-23: 2000 mL

## 2018-05-23 MED ORDER — MENTHOL 3 MG MT LOZG: 1.0000 | LOZENGE | OROMUCOSAL | Status: DC | PRN

## 2018-05-23 MED ORDER — ACETAMINOPHEN 500 MG PO TABS
1000.0000 mg | ORAL_TABLET | Freq: Once | ORAL | Status: AC
  Administered 2018-05-23: 1000 mg via ORAL
  Filled 2018-05-23: qty 2

## 2018-05-23 MED ORDER — HYDROMORPHONE HCL 1 MG/ML IJ SOLN: 0.5000 mg | INTRAMUSCULAR | Status: DC | PRN

## 2018-05-23 MED ORDER — LIDOCAINE 2% (20 MG/ML) 5 ML SYRINGE
INTRAMUSCULAR | Status: AC
  Filled 2018-05-23: qty 5

## 2018-05-23 MED ORDER — TRANEXAMIC ACID 1000 MG/10ML IV SOLN
1000.0000 mg | INTRAVENOUS | Status: AC
  Administered 2018-05-23: 1000 mg via INTRAVENOUS
  Filled 2018-05-23: qty 10

## 2018-05-23 MED ORDER — FLEET ENEMA 7-19 GM/118ML RE ENEM: 1.0000 | ENEMA | Freq: Once | RECTAL | Status: DC | PRN

## 2018-05-23 MED ORDER — OXYCODONE HCL 5 MG PO TABS
5.0000 mg | ORAL_TABLET | ORAL | Status: DC | PRN
  Administered 2018-05-23: 5 mg via ORAL
  Administered 2018-05-24 (×3): 10 mg via ORAL
  Filled 2018-05-23: qty 1
  Filled 2018-05-23 (×3): qty 2

## 2018-05-23 MED ORDER — ONDANSETRON HCL 4 MG/2ML IJ SOLN: 4.0000 mg | Freq: Once | INTRAMUSCULAR | Status: DC | PRN

## 2018-05-23 MED ORDER — DEXAMETHASONE SODIUM PHOSPHATE 10 MG/ML IJ SOLN
10.0000 mg | Freq: Once | INTRAMUSCULAR | Status: AC
  Administered 2018-05-24: 10 mg via INTRAVENOUS
  Filled 2018-05-23: qty 1

## 2018-05-23 MED ORDER — DEXAMETHASONE SODIUM PHOSPHATE 10 MG/ML IJ SOLN
8.0000 mg | Freq: Once | INTRAMUSCULAR | Status: AC
  Administered 2018-05-23: 10 mg via INTRAVENOUS

## 2018-05-23 MED ORDER — LACTATED RINGERS IV SOLN
INTRAVENOUS | Status: DC
  Administered 2018-05-23 (×2): via INTRAVENOUS

## 2018-05-23 MED ORDER — PROPOFOL 10 MG/ML IV BOLUS
INTRAVENOUS | Status: AC
  Filled 2018-05-23: qty 40

## 2018-05-23 MED ORDER — ROCURONIUM BROMIDE 10 MG/ML (PF) SYRINGE
PREFILLED_SYRINGE | INTRAVENOUS | Status: AC
  Filled 2018-05-23: qty 10

## 2018-05-23 MED ORDER — METHOCARBAMOL 500 MG IVPB - SIMPLE MED
500.0000 mg | Freq: Four times a day (QID) | INTRAVENOUS | Status: DC | PRN
  Administered 2018-05-23: 500 mg via INTRAVENOUS
  Filled 2018-05-23: qty 50

## 2018-05-23 MED ORDER — ROPIVACAINE HCL 7.5 MG/ML IJ SOLN
INTRAMUSCULAR | Status: DC | PRN
  Administered 2018-05-23: 30 mL via PERINEURAL

## 2018-05-23 MED ORDER — FENTANYL CITRATE (PF) 100 MCG/2ML IJ SOLN
INTRAMUSCULAR | Status: DC | PRN
  Administered 2018-05-23 (×2): 25 ug via INTRAVENOUS
  Administered 2018-05-23 (×3): 50 ug via INTRAVENOUS

## 2018-05-23 MED ORDER — SODIUM CHLORIDE 0.9% FLUSH
INTRAVENOUS | Status: DC | PRN
  Administered 2018-05-23: 20 mL

## 2018-05-23 MED ORDER — FENTANYL CITRATE (PF) 100 MCG/2ML IJ SOLN
INTRAMUSCULAR | Status: AC
  Filled 2018-05-23: qty 4

## 2018-05-23 MED ORDER — DOCUSATE SODIUM 100 MG PO CAPS
100.0000 mg | ORAL_CAPSULE | Freq: Two times a day (BID) | ORAL | Status: DC
  Administered 2018-05-23 – 2018-05-24 (×2): 100 mg via ORAL
  Filled 2018-05-23 (×2): qty 1

## 2018-05-23 MED ORDER — METOCLOPRAMIDE HCL 5 MG/ML IJ SOLN: 5.0000 mg | Freq: Three times a day (TID) | INTRAMUSCULAR | Status: DC | PRN

## 2018-05-23 MED ORDER — FENTANYL CITRATE (PF) 100 MCG/2ML IJ SOLN
50.0000 ug | Freq: Once | INTRAMUSCULAR | Status: AC
  Administered 2018-05-23: 100 ug via INTRAVENOUS
  Filled 2018-05-23: qty 2

## 2018-05-23 MED ORDER — AMLODIPINE BESYLATE 5 MG PO TABS
5.0000 mg | ORAL_TABLET | Freq: Every day | ORAL | Status: DC
  Administered 2018-05-24: 5 mg via ORAL
  Filled 2018-05-23: qty 1

## 2018-05-23 MED ORDER — CEFAZOLIN SODIUM-DEXTROSE 2-4 GM/100ML-% IV SOLN
2.0000 g | Freq: Four times a day (QID) | INTRAVENOUS | Status: AC
  Administered 2018-05-23 (×2): 2 g via INTRAVENOUS
  Filled 2018-05-23 (×2): qty 100

## 2018-05-23 MED ORDER — METHOCARBAMOL 500 MG PO TABS
500.0000 mg | ORAL_TABLET | Freq: Four times a day (QID) | ORAL | Status: DC | PRN
  Administered 2018-05-23 – 2018-05-24 (×3): 500 mg via ORAL
  Filled 2018-05-23 (×3): qty 1

## 2018-05-23 MED ORDER — BUPIVACAINE LIPOSOME 1.3 % IJ SUSP
INTRAMUSCULAR | Status: DC | PRN
  Administered 2018-05-23: 20 mL

## 2018-05-23 SURGICAL SUPPLY — 58 items
ARTISURF 10M PLYR 10-11EF KNEE (Knees) ×3 IMPLANT
BAG ZIPLOCK 12X15 (MISCELLANEOUS) ×3 IMPLANT
BANDAGE ACE 6X5 VEL STRL LF (GAUZE/BANDAGES/DRESSINGS) ×3 IMPLANT
BLADE SAW SGTL 13X75X1.27 (BLADE) ×3 IMPLANT
BLADE SAW SGTL 83.5X18.5 (BLADE) ×3 IMPLANT
BLADE SURG 15 STRL LF DISP TIS (BLADE) ×1 IMPLANT
BLADE SURG 15 STRL SS (BLADE) ×2
BOWL SMART MIX CTS (DISPOSABLE) ×3 IMPLANT
CEMENT BONE SIMPLEX SPEEDSET (Cement) ×6 IMPLANT
CLOSURE WOUND 1/2 X4 (GAUZE/BANDAGES/DRESSINGS) ×2
COVER SURGICAL LIGHT HANDLE (MISCELLANEOUS) ×3 IMPLANT
CUFF TOURN SGL QUICK 34 (TOURNIQUET CUFF) ×2
CUFF TRNQT CYL 34X4X40X1 (TOURNIQUET CUFF) ×1 IMPLANT
DECANTER SPIKE VIAL GLASS SM (MISCELLANEOUS) ×3 IMPLANT
DRAPE INCISE IOBAN 66X45 STRL (DRAPES) ×6 IMPLANT
DRAPE U-SHAPE 47X51 STRL (DRAPES) ×3 IMPLANT
DRESSING AQUACEL AG SP 3.5X10 (GAUZE/BANDAGES/DRESSINGS) ×1 IMPLANT
DRSG AQUACEL AG ADV 3.5X10 (GAUZE/BANDAGES/DRESSINGS) ×3 IMPLANT
DRSG AQUACEL AG SP 3.5X10 (GAUZE/BANDAGES/DRESSINGS) ×3
DURAPREP 26ML APPLICATOR (WOUND CARE) ×6 IMPLANT
ELECT REM PT RETURN 15FT ADLT (MISCELLANEOUS) ×3 IMPLANT
FEMUR  CMT CCR STD SZ10 R KNEE (Knees) ×2 IMPLANT
FEMUR CMT CCR STD SZ10 R KNEE (Knees) ×1 IMPLANT
FEMUR CMTD CCR STD SZ10 R KNEE (Knees) ×1 IMPLANT
GLOVE BIOGEL M 7.0 STRL (GLOVE) ×3 IMPLANT
GLOVE BIOGEL M STRL SZ7.5 (GLOVE) ×3 IMPLANT
GLOVE BIOGEL PI IND STRL 6.5 (GLOVE) ×1 IMPLANT
GLOVE BIOGEL PI IND STRL 7.5 (GLOVE) ×3 IMPLANT
GLOVE BIOGEL PI IND STRL 8.5 (GLOVE) ×2 IMPLANT
GLOVE BIOGEL PI INDICATOR 6.5 (GLOVE) ×2
GLOVE BIOGEL PI INDICATOR 7.5 (GLOVE) ×6
GLOVE BIOGEL PI INDICATOR 8.5 (GLOVE) ×4
GLOVE SURG ORTHO 8.0 STRL STRW (GLOVE) ×6 IMPLANT
GLOVE SURG SS PI 7.0 STRL IVOR (GLOVE) ×3 IMPLANT
GOWN STRL REUS W/ TWL XL LVL3 (GOWN DISPOSABLE) ×3 IMPLANT
GOWN STRL REUS W/TWL XL LVL3 (GOWN DISPOSABLE) ×6
HANDPIECE INTERPULSE COAX TIP (DISPOSABLE) ×2
HOLDER FOLEY CATH W/STRAP (MISCELLANEOUS) ×3 IMPLANT
HOOD PEEL AWAY FLYTE STAYCOOL (MISCELLANEOUS) ×9 IMPLANT
MANIFOLD NEPTUNE II (INSTRUMENTS) ×3 IMPLANT
PACK TOTAL KNEE CUSTOM (KITS) ×3 IMPLANT
POSITIONER SURGICAL ARM (MISCELLANEOUS) ×3 IMPLANT
SET HNDPC FAN SPRY TIP SCT (DISPOSABLE) ×1 IMPLANT
SPONGE LAP 18X18 RF (DISPOSABLE) IMPLANT
STEM POLY PAT PLY 35M KNEE (Knees) ×3 IMPLANT
STEM TIBIA 5 DEG SZ F R KNEE (Knees) ×1 IMPLANT
STRIP CLOSURE SKIN 1/2X4 (GAUZE/BANDAGES/DRESSINGS) ×4 IMPLANT
SUT BONE WAX W31G (SUTURE) ×3 IMPLANT
SUT MNCRL AB 4-0 PS2 18 (SUTURE) ×3 IMPLANT
SUT STRATAFIX 0 PDS 27 VIOLET (SUTURE) ×3
SUT STRATAFIX PDS+ 0 24IN (SUTURE) ×3 IMPLANT
SUT VIC AB 1 CT1 27 (SUTURE) ×2
SUT VIC AB 1 CT1 27XBRD ANTBC (SUTURE) ×1 IMPLANT
SUTURE STRATFX 0 PDS 27 VIOLET (SUTURE) ×1 IMPLANT
SYR CONTROL 10ML LL (SYRINGE) ×6 IMPLANT
TIBIA STEM 5 DEG SZ F R KNEE (Knees) ×3 IMPLANT
WRAP KNEE MAXI GEL POST OP (GAUZE/BANDAGES/DRESSINGS) ×3 IMPLANT
YANKAUER SUCT BULB TIP 10FT TU (MISCELLANEOUS) ×6 IMPLANT

## 2018-05-23 NOTE — Progress Notes (Signed)
AssistedDr. Oddono with right, ultrasound guided, adductor canal block. Side rails up, monitors on throughout procedure. See vital signs in flow sheet. Tolerated Procedure well.  

## 2018-05-23 NOTE — Evaluation (Signed)
Physical Therapy Evaluation Patient Details Name: Jonathon Gonzales MRN: [id number] DOB: [birth year] Today's Date: 05/23/2018   History of Present Illness  67 YO male s/p R TKR on 9/23. PMH includes lumbar stenosis/spondylolisthesis/synovial cyst L4-L5, OA, HTN. surgical history includes lumbar fusion 2015, eye surgery, R shoulder surgery 2011.   Clinical Impression   Pt presents with difficulty performing mobility tasks, R knee pain especially with bed mobility, decreased tolerance for ambulation, and decreased R knee ROM. Pt to benefit from acute PT to address deficits. Pt ambulated short hallway distance today with increasingly antalgic gait with further ambulation. PT to continue to progress mobility and will continue to follow acutely.     Follow Up Recommendations Follow surgeon's recommendation for DC plan and follow-up therapies;Supervision for mobility/OOB(Starts OPPT on the 4th of october, HHPT prior)    Equipment Recommendations  None recommended by PT    Recommendations for Other Services       Precautions / Restrictions Precautions Precautions: Fall;Knee Restrictions Weight Bearing Restrictions: No Other Position/Activity Restrictions: WBAT       Mobility  Bed Mobility Overal bed mobility: Needs Assistance Bed Mobility: Supine to Sit     Supine to sit: Min assist     General bed mobility comments: Min assist for LE management, trunk assist. Verbal cuing for sequencing to get to EOB. Increased time/effort   Transfers Overall transfer level: Needs assistance Equipment used: Rolling walker (2 wheeled) Transfers: Sit to/from Stand Sit to Stand: Min guard         General transfer comment: Min guard for safety. Shifting L and R without R knee buckling.   Ambulation/Gait Ambulation/Gait assistance: Min guard Gait Distance (Feet): 45 Feet Assistive device: Rolling walker (2 wheeled) Gait Pattern/deviations: Step-to pattern;Antalgic;Decreased stance time -  right;Decreased weight shift to right;Trunk flexed;Decreased stride length Gait velocity: decr    General Gait Details: Min guard for safety. Verbal cuing for sequencing, placement in RW. Pt with increased antalgic gait with further ambulation.   Stairs            Wheelchair Mobility    Modified Rankin (Stroke Patients Only)       Balance Overall balance assessment: Mild deficits observed, not formally tested                                           Pertinent Vitals/Pain Pain Assessment: 0-10 Pain Score: 3  Pain Location: R knee  Pain Descriptors / Indicators: Constant;Sore Pain Intervention(s): Limited activity within patient's tolerance;Repositioned;Ice applied;Monitored during session    [place] expects to be discharged to:: Private residence Living Arrangements: Spouse/significant other Available Help at Discharge: Family;Available PRN/intermittently Type of Home: House Home Access: Stairs to enter Entrance Stairs-Rails: Psychiatric nurse of Steps: 3 Home Layout: One level Home Equipment: Walker - 2 wheels;Crutches;Bedside commode;Shower seat;Wheelchair - Psychologist, educational      Prior Function Level of Independence: Independent               Hand Dominance   Dominant Hand: Right    Extremity/Trunk Assessment   Upper Extremity Assessment Upper Extremity Assessment: Overall WFL for tasks assessed    Lower Extremity Assessment Lower Extremity Assessment: Overall WFL for tasks assessed;RLE deficits/detail RLE Deficits / Details: suspected post-surgical weakness; able to perform quad sets x5, SLR with assist x1  RLE Sensation: decreased light touch(toes bilat; secondary to back surgery 2015)  Communication   Communication: No difficulties  Cognition Arousal/Alertness: Awake/alert Behavior During Therapy: WFL for tasks assessed/performed Overall Cognitive Status: Within Functional  Limits for tasks assessed                                        General Comments      Exercises Total Joint Exercises Ankle Circles/Pumps: AROM;Both;10 reps;Supine Quad Sets: AROM;Right;5 reps;Supine Heel Slides: AAROM;Right(1 rep, limited reps due to pain ) Goniometric ROM: knee AAROM approximately 5-55*, limited by pain    Assessment/Plan    PT Assessment Patient needs continued PT services  PT Problem List Decreased strength;Pain;Decreased range of motion;Decreased activity tolerance;Decreased knowledge of use of DME;Decreased balance;Decreased mobility;Decreased safety awareness       PT Treatment Interventions DME instruction;Therapeutic activities;Gait training;Therapeutic exercise;Patient/family education;Stair training;Balance training;Functional mobility training    PT Goals (Current goals can be found in the Care Plan section)  Acute Rehab PT Goals PT Goal Formulation: With patient/family Time For Goal Achievement: 06/06/18 Potential to Achieve Goals: Good    Frequency 7X/week   Barriers to discharge        Co-evaluation               AM-PAC PT "6 Clicks" Daily Activity  Outcome Measure Difficulty turning over in bed (including adjusting bedclothes, sheets and blankets)?: Unable Difficulty moving from lying on back to sitting on the side of the bed? : Unable Difficulty sitting down on and standing up from a chair with arms (e.g., wheelchair, bedside commode, etc,.)?: Unable Help needed moving to and from a bed to chair (including a wheelchair)?: A Little Help needed walking in hospital room?: A Little Help needed climbing 3-5 steps with a railing? : A Little 6 Click Score: 12    End of Session Equipment Utilized During Treatment: Gait belt Activity Tolerance: Patient tolerated treatment well Patient left: in chair;with chair alarm set;with call bell/phone within reach;with family/visitor present;with SCD's reapplied Nurse  Communication: Mobility status PT Visit Diagnosis: Other abnormalities of gait and mobility (R26.89);Difficulty in walking, not elsewhere classified (R26.2)    Time: 1710-1746 PT Time Calculation (min) (ACUTE ONLY): 36 min   Charges:   PT Evaluation $PT Eval Low Complexity: 1 Low PT Treatments $Gait Training: 8-22 mins       Julien Girt, PT Acute Rehabilitation Services Pager [phone]  Office [phone]   Tabitha Riggins D Elonda Husky 05/23/2018, 7:20 PM

## 2018-05-23 NOTE — Anesthesia Procedure Notes (Signed)
Spinal  Patient location during procedure: OR Start time: 05/23/2018 10:50 AM End time: 05/23/2018 11:00 AM Staffing Anesthesiologist: Janeece Riggers, MD Preanesthetic Checklist Completed: patient identified, site marked, surgical consent, pre-op evaluation, timeout performed, IV checked, risks and benefits discussed and monitors and equipment checked Spinal Block Patient position: sitting Prep: DuraPrep Patient monitoring: heart rate, cardiac monitor, continuous pulse ox and blood pressure Approach: midline Location: L3-4 Injection technique: single-shot Needle Needle type: Sprotte  Needle gauge: 24 G Needle length: 9 cm Assessment Sensory level: T4 Events: failed spinal Additional Notes Multiple attempts above previous incision with out success.

## 2018-05-23 NOTE — H&P (Signed)
Jonathon Gonzales MRN:  [id number] DOB/SEX:  1951/04/29/male  CHIEF COMPLAINT:  Painful right Knee  HISTORY: Patient is a 67 y.o. male presented with a history of pain in the right knee. Onset of symptoms was gradual starting a few years ago with gradually worsening course since that time. Patient has been treated conservatively with over-the-counter NSAIDs and activity modification. Patient currently rates pain in the knee at 10 out of 10 with activity. There is pain at night.  PAST MEDICAL HISTORY: Patient Active Problem List   Diagnosis Date Noted  . Synovial cyst of lumbar facet joint 08/07/2014   Past Medical History:  Diagnosis Date  . Arthritis   . GERD (gastroesophageal reflux disease)   . Hypertension   . Numbness    in feet  since back surgery   . PONV (postoperative nausea and vomiting)    reports post op, Ambulatory shoulder surgery vomited  in the parking lot on his way home ; reports anesthesia gave hom nause medicine but it wasnt enough    Past Surgical History:  Procedure Laterality Date  . BACK SURGERY    . dental implants     . EYE SURGERY    . FRACTURE SURGERY    . LYMPH GLAND EXCISION    . MAXIMUM ACCESS (MAS)POSTERIOR LUMBAR INTERBODY FUSION (PLIF) 1 LEVEL N/A 08/07/2014   Procedure: Redo Laminectomy/Resection of synovial cyst Lumbar four-five with Fusion Mass Access Posterior Lumbar Interbody Fusion;  Surgeon: Erline Levine, MD;  Location: [facility];  Service: Neurosurgery;  Laterality: N/A;  . shoudler surgery  Right 2018   Dr Vickey Huger at [facility]   . VASECTOMY       MEDICATIONS:   No medications prior to admission.    ALLERGIES:  No Known Allergies  REVIEW OF SYSTEMS:  A comprehensive review of systems was negative except for: Musculoskeletal: positive for arthralgias and bone pain   FAMILY HISTORY:  No family history on file.  SOCIAL HISTORY:   Social History   Tobacco Use  . Smoking status: Former Research scientist (life sciences)  . Smokeless tobacco: Never  Used  Substance Use Topics  . Alcohol use: Yes    Comment: occ     EXAMINATION:  Vital signs in last 24 hours:    There were no vitals taken for this visit.  General Appearance:    Alert, cooperative, no distress, appears stated age  Head:    Normocephalic, without obvious abnormality, atraumatic  Eyes:    PERRL, conjunctiva/corneas clear, EOM's intact, fundi    benign, both eyes       Ears:    Normal TM's and external ear canals, both ears  Nose:   Nares normal, septum midline, mucosa normal, no drainage    or sinus tenderness  Throat:   Lips, mucosa, and tongue normal; teeth and gums normal  Neck:   Supple, symmetrical, trachea midline, no adenopathy;       thyroid:  No enlargement/tenderness/nodules; no carotid   bruit or JVD  Back:     Symmetric, no curvature, ROM normal, no CVA tenderness  Lungs:     Clear to auscultation bilaterally, respirations unlabored  Chest wall:    No tenderness or deformity  Heart:    Regular rate and rhythm, S1 and S2 normal, no murmur, rub   or gallop  Abdomen:     Soft, non-tender, bowel sounds active all four quadrants,    no masses, no organomegaly  Genitalia:    Normal male without lesion, discharge  or tenderness  Rectal:    Normal tone, normal prostate, no masses or tenderness;   guaiac negative stool  Extremities:   Extremities normal, atraumatic, no cyanosis or edema  Pulses:   2+ and symmetric all extremities  Skin:   Skin color, texture, turgor normal, no rashes or lesions  Lymph nodes:   Cervical, supraclavicular, and axillary nodes normal  Neurologic:   CNII-XII intact. Normal strength, sensation and reflexes      throughout    Musculoskeletal:  ROM 0-120, Ligaments intact,  Imaging Review Plain radiographs demonstrate severe degenerative joint disease of the right knee. The overall alignment is neutral. The bone quality appears to be good for age and reported activity level.  Assessment/Plan: Primary osteoarthritis, right knee    The patient history, physical examination and imaging studies are consistent with advanced degenerative joint disease of the right knee. The patient has failed conservative treatment.  The clearance notes were reviewed.  After discussion with the patient it was felt that Total Knee Replacement was indicated. The procedure,  risks, and benefits of total knee arthroplasty were presented and reviewed. The risks including but not limited to aseptic loosening, infection, blood clots, vascular injury, stiffness, patella tracking problems complications among others were discussed. The patient acknowledged the explanation, agreed to proceed with the plan.  Preoperative templating of the joint replacement has been completed, documented, and submitted to the Operating Room personnel in order to optimize intra-operative equipment management.    Patient's anticipated LOS is less than 2 midnights, meeting these requirements: - Lives within 1 hour of care - Has a competent adult at home to recover with post-op recover - NO history of  - Chronic pain requiring opiods  - Diabetes  - Coronary Artery Disease  - Heart failure  - Heart attack  - Stroke  - DVT/VTE  - Cardiac arrhythmia  - Respiratory Failure/COPD  - Renal failure  - Anemia  - Advanced Liver disease       Donia Ast 05/23/2018, 6:21 AM

## 2018-05-23 NOTE — Transfer of Care (Signed)
Immediate Anesthesia Transfer of Care Note  Patient: Jonathon Gonzales  Procedure(s) Performed: RIGHT TOTAL KNEE ARTHROPLASTY (Right Knee)  Patient Location: PACU  Anesthesia Type:General and Regional  Level of Consciousness: awake, alert  and oriented  Airway & Oxygen Therapy: Patient Spontanous Breathing and Patient connected to nasal cannula oxygen  Post-op Assessment: Report given to RN and Post -op Vital signs reviewed and stable  Post vital signs: Reviewed and stable  Last Vitals:  Vitals Value Taken Time  BP    Temp    Pulse 63 05/23/2018  1:00 PM  Resp    SpO2 98 % 05/23/2018  1:00 PM  Vitals shown include unvalidated device data.  Last Pain:  Vitals:   05/23/18 1029  TempSrc:   PainSc: 0-No pain         Complications: No apparent anesthesia complications

## 2018-05-23 NOTE — Anesthesia Postprocedure Evaluation (Signed)
Anesthesia Post Note  Patient: Jonathon Gonzales  Procedure(s) Performed: RIGHT TOTAL KNEE ARTHROPLASTY (Right Knee)     Patient location during evaluation: PACU Anesthesia Type: General Level of consciousness: sedated Pain management: pain level controlled Vital Signs Assessment: post-procedure vital signs reviewed and stable Respiratory status: spontaneous breathing and respiratory function stable Cardiovascular status: stable Postop Assessment: no apparent nausea or vomiting Anesthetic complications: no    Last Vitals:  Vitals:   05/23/18 1330 05/23/18 1345  BP: 128/79 126/88  Pulse: (!) 55 (!) 58  Resp: 15 15  Temp: 36.5 C   SpO2: 100% 99%    Last Pain:  Vitals:   05/23/18 1330  TempSrc:   PainSc: 6                  [name],[name]

## 2018-05-23 NOTE — Plan of Care (Signed)

## 2018-05-23 NOTE — Anesthesia Procedure Notes (Addendum)
Anesthesia Regional Block: Adductor canal block   Pre-Anesthetic Checklist: ,, timeout performed, Correct Patient, Correct Site, Correct Laterality, Correct Procedure, Correct Position, site marked, Risks and benefits discussed,  Surgical consent,  Pre-op evaluation,  At surgeon's request and post-op pain management  Laterality: Right  Prep: chloraprep       Needles:  Injection technique: Single-shot  Needle Type: Echogenic Stimulator Needle     Needle Length: 5cm  Needle Gauge: 22     Additional Needles:   Procedures:, nerve stimulator,,, ultrasound used (permanent image in chart),,,,  Narrative:  Start time: 05/23/2018 10:05 AM End time: 05/23/2018 10:10 AM Injection made incrementally with aspirations every 5 mL.  Performed by: Personally  Anesthesiologist: Janeece Riggers, MD  Additional Notes: Functioning IV was confirmed and monitors were applied.  A 67mm 22ga Arrow echogenic stimulator needle was used. Sterile prep and drape,hand hygiene and sterile gloves were used. Ultrasound guidance: relevant anatomy identified, needle position confirmed, local anesthetic spread visualized around nerve(s)., vascular puncture avoided.  Image printed for medical record. Negative aspiration and negative test dose prior to incremental administration of local anesthetic. The patient tolerated the procedure well.

## 2018-05-23 NOTE — Anesthesia Procedure Notes (Deleted)
Spinal

## 2018-05-23 NOTE — Anesthesia Procedure Notes (Signed)
Procedure Name: LMA Insertion Date/Time: 05/23/2018 11:02 AM Performed by: Lateshia Schmoker D, CRNA Pre-anesthesia Checklist: Patient identified, Emergency Drugs available, Suction available and Patient being monitored Patient Re-evaluated:Patient Re-evaluated prior to induction Oxygen Delivery Method: Circle system utilized Preoxygenation: Pre-oxygenation with 100% oxygen Induction Type: IV induction Ventilation: Mask ventilation without difficulty LMA: LMA inserted LMA Size: 4.0 Tube type: Oral Number of attempts: 1 Placement Confirmation: positive ETCO2 and breath sounds checked- equal and bilateral Tube secured with: Tape Dental Injury: Teeth and Oropharynx as per pre-operative assessment

## 2018-05-24 ENCOUNTER — Encounter (HOSPITAL_COMMUNITY): Payer: Self-pay | Admitting: Orthopedic Surgery

## 2018-05-24 DIAGNOSIS — M1711 Unilateral primary osteoarthritis, right knee: Secondary | ICD-10-CM | POA: Diagnosis not present

## 2018-05-24 DIAGNOSIS — I10 Essential (primary) hypertension: Secondary | ICD-10-CM | POA: Diagnosis not present

## 2018-05-24 DIAGNOSIS — Z981 Arthrodesis status: Secondary | ICD-10-CM | POA: Diagnosis not present

## 2018-05-24 DIAGNOSIS — Z79899 Other long term (current) drug therapy: Secondary | ICD-10-CM | POA: Diagnosis not present

## 2018-05-24 DIAGNOSIS — Z87891 Personal history of nicotine dependence: Secondary | ICD-10-CM | POA: Diagnosis not present

## 2018-05-24 DIAGNOSIS — K219 Gastro-esophageal reflux disease without esophagitis: Secondary | ICD-10-CM | POA: Diagnosis not present

## 2018-05-24 LAB — BASIC METABOLIC PANEL
ANION GAP: 7 (ref 5–15)
BUN: 15 mg/dL (ref 8–23)
CHLORIDE: 107 mmol/L (ref 98–111)
CO2: 27 mmol/L (ref 22–32)
Calcium: 9.1 mg/dL (ref 8.9–10.3)
Creatinine, Ser: 0.89 mg/dL (ref 0.61–1.24)
GFR calc non Af Amer: >60 mL/min (ref >60)
Glucose, Bld: 132 mg/dL — ABNORMAL HIGH (ref 70–99)
Potassium: 4.6 mmol/L (ref 3.5–5.1)
Sodium: 141 mmol/L (ref 135–145)

## 2018-05-24 LAB — CBC
HEMATOCRIT: 41.6 % (ref 39.0–52.0)
Hemoglobin: 13.7 g/dL (ref 13.0–17.0)
MCH: 30.5 pg (ref 26.0–34.0)
MCHC: 32.9 g/dL (ref 30.0–36.0)
MCV: 92.7 fL (ref 78.0–100.0)
Platelets: 184 10*3/uL (ref 150–400)
RBC: 4.49 MIL/uL (ref 4.22–5.81)
RDW: 13.1 % (ref 11.5–15.5)
WBC: 12.3 10*3/uL — ABNORMAL HIGH (ref 4.0–10.5)

## 2018-05-24 MED ORDER — ASPIRIN 325 MG PO TBEC: 325.0000 mg | DELAYED_RELEASE_TABLET | Freq: Two times a day (BID) | ORAL | 0 refills | Status: AC

## 2018-05-24 MED ORDER — OXYCODONE HCL 5 MG PO TABS: 5.0000 mg | ORAL_TABLET | Freq: Four times a day (QID) | ORAL | 0 refills | Status: AC | PRN

## 2018-05-24 MED ORDER — METHOCARBAMOL 500 MG PO TABS: 500.0000 mg | ORAL_TABLET | Freq: Four times a day (QID) | ORAL | 0 refills | Status: AC | PRN

## 2018-05-24 NOTE — Discharge Summary (Signed)
SPORTS MEDICINE & JOINT REPLACEMENT   Lara Mulch, MD   Carlyon Shadow, PA-C [place], [place], [place]  [address]                             [phone]  PATIENT ID: Jonathon Gonzales        MRN:  [id number]          DOB/AGE: [birth year] / 67 y.o.    DISCHARGE SUMMARY  ADMISSION DATE:    05/23/2018 DISCHARGE DATE:   05/24/2018   ADMISSION DIAGNOSIS: OSTEOARTHRIS RIGHT KNEE    DISCHARGE DIAGNOSIS:  OSTEOARTHRIS RIGHT KNEE    ADDITIONAL DIAGNOSIS: Active Problems:   S/P total knee replacement  Past Medical History:  Diagnosis Date  . Arthritis   . GERD (gastroesophageal reflux disease)   . Hypertension   . Numbness    in feet  since back surgery   . PONV (postoperative nausea and vomiting)    reports post op, Ambulatory shoulder surgery vomited  in the parking lot on his way home ; reports anesthesia gave hom nause medicine but it wasnt enough     PROCEDURE: Procedure(s): RIGHT TOTAL KNEE ARTHROPLASTY on 05/23/2018  CONSULTS:    HISTORY:  See H&P in chart  HOSPITAL COURSE:  Jonathon Gonzales is a 67 y.o. 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  After appropriate laboratory studies were obtained  they were taken to the operating room on 05/23/2018 and underwent Procedure(s): RIGHT TOTAL KNEE ARTHROPLASTY.   They were given perioperative antibiotics:  Anti-infectives (From admission, onward)   Start     Dose/Rate Route Frequency Ordered Stop   05/23/18 1700  ceFAZolin (ANCEF) IVPB 2g/100 mL premix     2 g 200 mL/hr over 30 Minutes Intravenous Every 6 hours 05/23/18 1424 05/23/18 2234   05/23/18 0830  ceFAZolin (ANCEF) IVPB 2g/100 mL premix     2 g 200 mL/hr over 30 Minutes Intravenous On call to O.R. 05/23/18 7124 05/23/18 1104    .  Patient given tranexamic acid IV or topical and exparel intra-operatively.  Tolerated the procedure well.    POD# 1: Vital signs were stable.  Patient denied Chest pain, shortness of breath,  or calf pain.  Patient was started on Aspirin twice daily at 8am.  Consults to PT, OT, and care management were made.  The patient was weight bearing as tolerated.  CPM was placed on the operative leg 0-90 degrees for 6-8 hours a day. When out of the CPM, patient was placed in the foam block to achieve full extension. Incentive spirometry was taught.  Dressing was changed.       POD #2, Continued  PT for ambulation and exercise program.  IV saline locked.  O2 discontinued.    The remainder of the hospital course was dedicated to ambulation and strengthening.   The patient was discharged on 1 Day Post-Op in  Good condition.  Blood products given:none  DIAGNOSTIC STUDIES: Recent vital signs:  Patient Vitals for the past 24 hrs:  BP Temp Temp src Pulse Resp SpO2  05/24/18 1108 (!) 153/78 (!) 97.5 F (36.4 C) Oral 68 16 97 %  05/24/18 0604 116/72 97.7 F (36.5 C) Oral 63 16 97 %  05/23/18 2202 (!) 150/76 (!) 97.4 F (36.3 C) Oral 73 - 95 %  05/23/18 1710 125/77 97.7 F (36.5 C) Oral 63 15 97 %  05/23/18 1626  119/70 (!) 97.5 F (36.4 C) Oral 61 15 96 %  05/23/18 1459 134/69 97.7 F (36.5 C) Oral 68 15 95 %  05/23/18 1410 123/71 97.7 F (36.5 C) Oral (!) 55 - 98 %  05/23/18 1345 126/88 - - (!) 58 15 99 %  05/23/18 1330 128/79 97.7 F (36.5 C) - (!) 55 15 100 %  05/23/18 1315 117/80 - - 63 15 100 %  05/23/18 1300 120/70 97.7 F (36.5 C) - 63 (!) 7 98 %       Recent laboratory studies: Recent Labs    05/24/18 0449  WBC 12.3*  HGB 13.7  HCT 41.6  PLT 184   Recent Labs    05/24/18 0449  NA 141  K 4.6  CL 107  CO2 27  BUN 15  CREATININE 0.89  GLUCOSE 132*  CALCIUM 9.1   No results found for: INR, PROTIME   Recent Radiographic Studies :  No results found.  DISCHARGE INSTRUCTIONS: Discharge Instructions    CPM   Complete by:  As directed    Continuous passive motion machine (CPM):      Use the CPM from 0 to 90 for 4-6 hours per day.      You may increase by 10  per day.  You may break it up into 2 or 3 sessions per day.      Use CPM for 2 weeks or until you are told to stop.   Call MD / Call 911   Complete by:  As directed    If you experience chest pain or shortness of breath, CALL 911 and be transported to the hospital emergency room.  If you develope a fever above 101 F, pus (white drainage) or increased drainage or redness at the wound, or calf pain, call your surgeon's office.   Constipation Prevention   Complete by:  As directed    Drink plenty of fluids.  Prune juice may be helpful.  You may use a stool softener, such as Colace (over the counter) 100 mg twice a day.  Use MiraLax (over the counter) for constipation as needed.   Diet - low sodium heart healthy   Complete by:  As directed    Discharge instructions   Complete by:  As directed    INSTRUCTIONS AFTER JOINT REPLACEMENT   Remove items at home which could result in a fall. This includes throw rugs or furniture in walking pathways ICE to the affected joint every three hours while awake for 30 minutes at a time, for at least the first 3-5 days, and then as needed for pain and swelling.  Continue to use ice for pain and swelling. You may notice swelling that will progress down to the foot and ankle.  This is normal after surgery.  Elevate your leg when you are not up walking on it.   Continue to use the breathing machine you got in the hospital (incentive spirometer) which will help keep your temperature down.  It is common for your temperature to cycle up and down following surgery, especially at night when you are not up moving around and exerting yourself.  The breathing machine keeps your lungs expanded and your temperature down.   DIET:  As you were doing prior to hospitalization, we recommend a well-balanced diet.  DRESSING / WOUND CARE / SHOWERING  Keep the surgical dressing until follow up.  The dressing is water proof, so you can shower without any extra covering.  IF THE  DRESSING  FALLS OFF or the wound gets wet inside, change the dressing with sterile gauze.  Please use good hand washing techniques before changing the dressing.  Do not use any lotions or creams on the incision until instructed by your surgeon.    ACTIVITY  Increase activity slowly as tolerated, but follow the weight bearing instructions below.   No driving for 6 weeks or until further direction given by your physician.  You cannot drive while taking narcotics.  No lifting or carrying greater than 10 lbs. until further directed by your surgeon. Avoid periods of inactivity such as sitting longer than an hour when not asleep. This helps prevent blood clots.  You may return to work once you are authorized by your doctor.     WEIGHT BEARING   Weight bearing as tolerated with assist device (walker, cane, etc) as directed, use it as long as suggested by your surgeon or therapist, typically at least 4-6 weeks.   EXERCISES  Results after joint replacement surgery are often greatly improved when you follow the exercise, range of motion and muscle strengthening exercises prescribed by your doctor. Safety measures are also important to protect the joint from further injury. Any time any of these exercises cause you to have increased pain or swelling, decrease what you are doing until you are comfortable again and then slowly increase them. If you have problems or questions, call your caregiver or physical therapist for advice.   Rehabilitation is important following a joint replacement. After just a few days of immobilization, the muscles of the leg can become weakened and shrink (atrophy).  These exercises are designed to build up the tone and strength of the thigh and leg muscles and to improve motion. Often times heat used for twenty to thirty minutes before working out will loosen up your tissues and help with improving the range of motion but do not use heat for the first two weeks following surgery (sometimes  heat can increase post-operative swelling).   These exercises can be done on a training (exercise) mat, on the floor, on a table or on a bed. Use whatever works the best and is most comfortable for you.    Use music or television while you are exercising so that the exercises are a pleasant break in your day. This will make your life better with the exercises acting as a break in your routine that you can look forward to.   Perform all exercises about fifteen times, three times per day or as directed.  You should exercise both the operative leg and the other leg as well.   Exercises include:   Quad Sets - Tighten up the muscle on the front of the thigh (Quad) and hold for 5-10 seconds.   Straight Leg Raises - With your knee straight (if you were given a brace, keep it on), lift the leg to 60 degrees, hold for 3 seconds, and slowly lower the leg.  Perform this exercise against resistance later as your leg gets stronger.  Leg Slides: Lying on your back, slowly slide your foot toward your buttocks, bending your knee up off the floor (only go as far as is comfortable). Then slowly slide your foot back down until your leg is flat on the floor again.  Angel Wings: Lying on your back spread your legs to the side as far apart as you can without causing discomfort.  Hamstring Strength:  Lying on your back, push your heel against the floor with  your leg straight by tightening up the muscles of your buttocks.  Repeat, but this time bend your knee to a comfortable angle, and push your heel against the floor.  You may put a pillow under the heel to make it more comfortable if necessary.   A rehabilitation program following joint replacement surgery can speed recovery and prevent re-injury in the future due to weakened muscles. Contact your doctor or a physical therapist for more information on knee rehabilitation.    CONSTIPATION  Constipation is defined medically as fewer than three stools per week and severe  constipation as less than one stool per week.  Even if you have a regular bowel pattern at home, your normal regimen is likely to be disrupted due to multiple reasons following surgery.  Combination of anesthesia, postoperative narcotics, change in appetite and fluid intake all can affect your bowels.   YOU MUST use at least one of the following options; they are listed in order of increasing strength to get the job done.  They are all available over the counter, and you may need to use some, POSSIBLY even all of these options:    Drink plenty of fluids (prune juice may be helpful) and high fiber foods Colace 100 mg by mouth twice a day  Senokot for constipation as directed and as needed Dulcolax (bisacodyl), take with full glass of water  Miralax (polyethylene glycol) once or twice a day as needed.  If you have tried all these things and are unable to have a bowel movement in the first 3-4 days after surgery call either your surgeon or your primary doctor.    If you experience loose stools or diarrhea, hold the medications until you stool forms back up.  If your symptoms do not get better within 1 week or if they get worse, check with your doctor.  If you experience "the worst abdominal pain ever" or develop nausea or vomiting, please contact the office immediately for further recommendations for treatment.   ITCHING:  If you experience itching with your medications, try taking only a single pain pill, or even half a pain pill at a time.  You can also use Benadryl over the counter for itching or also to help with sleep.   TED HOSE STOCKINGS:  Use stockings on both legs until for at least 2 weeks or as directed by physician office. They may be removed at night for sleeping.  MEDICATIONS:  See your medication summary on the "After Visit Summary" that nursing will review with you.  You may have some home medications which will be placed on hold until you complete the course of blood thinner  medication.  It is important for you to complete the blood thinner medication as prescribed.  PRECAUTIONS:  If you experience chest pain or shortness of breath - call 911 immediately for transfer to the hospital emergency department.   If you develop a fever greater that 101 F, purulent drainage from wound, increased redness or drainage from wound, foul odor from the wound/dressing, or calf pain - CONTACT YOUR SURGEON.                                                   FOLLOW-UP APPOINTMENTS:  If you do not already have a post-op appointment, please call the office for an appointment to be seen  by your surgeon.  Guidelines for how soon to be seen are listed in your "After Visit Summary", but are typically between 1-4 weeks after surgery.  OTHER INSTRUCTIONS:   Knee Replacement:  Do not place pillow under knee, focus on keeping the knee straight while resting. CPM instructions: 0-90 degrees, 2 hours in the morning, 2 hours in the afternoon, and 2 hours in the evening. Place foam block, curve side up under heel at all times except when in CPM or when walking.  DO NOT modify, tear, cut, or change the foam block in any way.  MAKE SURE YOU:  Understand these instructions.  Get help right away if you are not doing well or get worse.    Thank you for letting us be a part of your medical care team.  It is a privilege we respect greatly.  We hope these instructions will help you stay on track for a fast and full recovery!   Increase activity slowly as tolerated   Complete by:  As directed       DISCHARGE MEDICATIONS:   Allergies as of 05/24/2018   No Known Allergies     Medication List    STOP taking these medications   meloxicam 15 MG tablet Commonly known as:  MOBIC     TAKE these medications   acetaminophen 500 MG tablet Commonly known as:  TYLENOL Take 1,000 mg by mouth 2 (two) times daily as needed (for pain).   amLODipine 5 MG tablet Commonly known as:  NORVASC Take 5 mg by mouth  daily.   aspirin 325 MG EC tablet Take 1 tablet (325 mg total) by mouth 2 (two) times daily.   cyanocobalamin 2000 MCG tablet Take 2,000 mcg by mouth daily.   methocarbamol 500 MG tablet Commonly known as:  ROBAXIN Take 1-2 tablets (500-1,000 mg total) by mouth every 6 (six) hours as needed for muscle spasms.   multivitamin with minerals Tabs tablet Take 1 tablet by mouth daily.   omeprazole 20 MG capsule Commonly known as:  PRILOSEC Take 20 mg by mouth daily.   oxyCODONE 5 MG immediate release tablet Commonly known as:  Oxy IR/ROXICODONE Take 1-2 tablets (5-10 mg total) by mouth every 6 (six) hours as needed for moderate pain (pain score 4-6).            Durable Medical Equipment  (From admission, onward)         Start     Ordered   05/23/18 1425  DME Walker rolling  Once    Question:  Patient needs a walker to treat with the following condition  Answer:  S/P total knee replacement   05/23/18 1424   05/23/18 1425  DME 3 n 1  Once     05/23/18 1424   05/23/18 1425  DME Bedside commode  Once    Question:  Patient needs a bedside commode to treat with the following condition  Answer:  S/P total knee replacement   05/23/18 1424          FOLLOW UP VISIT:   Follow-up Information    [facility] Follow up.   Specialty:  [place] Why:  physical therapy Contact information: [place] [address] [phone]           DISPOSITION: HOME VS. SNF  CONDITION:  Good   Donia Ast 05/24/2018, 12:53 PM

## 2018-05-24 NOTE — Care Management Obs Status (Signed)
[facility] NOTIFICATION   Patient Details  Name: Jonathon Gonzales MRN: [id number] Date of Birth: Dec 19, 1950   Medicare Observation Status Notification Given:  Yes    Guadalupe Maple, RN 05/24/2018, 10:59 AM

## 2018-05-24 NOTE — Progress Notes (Signed)
SPORTS MEDICINE AND JOINT REPLACEMENT  Lara Mulch, MD    Carlyon Shadow, PA-C [place], [place], [place]  [address]                             [phone]   PROGRESS NOTE  Subjective:  negative for Chest Pain  negative for Shortness of Breath  negative for Nausea/Vomiting   negative for Calf Pain  negative for Bowel Movement   Tolerating Diet: yes         Patient reports pain as 3 on 0-10 scale.    Objective: Vital signs in last 24 hours:    Patient Vitals for the past 24 hrs:  BP Temp Temp src Pulse Resp SpO2 Height Weight  05/24/18 0604 116/72 97.7 F (36.5 C) Oral 63 16 97 % - -  05/23/18 2202 (!) 150/76 (!) 97.4 F (36.3 C) Oral 73 - 95 % - -  05/23/18 1710 125/77 97.7 F (36.5 C) Oral 63 15 97 % - -  05/23/18 1626 119/70 (!) 97.5 F (36.4 C) Oral 61 15 96 % - -  05/23/18 1459 134/69 97.7 F (36.5 C) Oral 68 15 95 % - -  05/23/18 1410 123/71 97.7 F (36.5 C) Oral (!) 55 - 98 % - -  05/23/18 1345 126/88 - - (!) 58 15 99 % - -  05/23/18 1330 128/79 97.7 F (36.5 C) - (!) 55 15 100 % - -  05/23/18 1315 117/80 - - 63 15 100 % - -  05/23/18 1300 120/70 97.7 F (36.5 C) - 63 (!) 7 98 % - -  05/23/18 1029 - - - 62 13 95 % - -  05/23/18 1028 - - - 63 11 95 % - -  05/23/18 1027 - - - 65 12 97 % - -  05/23/18 1026 - - - 65 14 96 % - -  05/23/18 1025 (!) 131/99 - - 62 11 96 % - -  05/23/18 1024 - - - 67 15 95 % - -  05/23/18 1023 - - - 65 13 94 % - -  05/23/18 1022 - - - 62 12 95 % - -  05/23/18 1021 137/74 - - 60 13 93 % - -  05/23/18 1020 - - - 63 14 95 % - -  05/23/18 1019 - - - 65 14 97 % - -  05/23/18 1018 - - - 61 14 95 % - -  05/23/18 1017 - - - 61 11 94 % - -  05/23/18 1016 - - - 62 13 94 % - -  05/23/18 1015 (!) 141/77 - - 71 13 96 % - -  05/23/18 1014 - - - 71 15 96 % - -  05/23/18 1013 - - - 65 13 97 % - -  05/23/18 1012 - - - 65 12 95 % - -  05/23/18 1011 - - - 70 16 96 % - -  05/23/18 1010 140/83 - - 65 12 97 % - -  05/23/18 1009 - -  - 68 (!) 7 97 % - -  05/23/18 1008 - - - 64 10 97 % - -  05/23/18 1007 - - - 71 12 96 % - -  05/23/18 1006 (!) 153/85 - - 74 12 98 % - -  05/23/18 1005 - - - 66 10 99 % - -  05/23/18 1004 - - - 64 10  98 % - -  05/23/18 1003 - - - 68 - 97 % - -  05/23/18 0829 (!) 149/79 98.2 F (36.8 C) Oral (!) 58 16 99 % - -  05/23/18 9390 - - - - - - 6\' 2"  (1.88 m) 111.6 kg    @flow {1959:LAST@   Intake/Output from previous day:   09/23 0701 - 09/24 0700 In: 3378.8 [P.O.:420; I.V.:2958.8] Out: 2600 [Urine:2600]   Intake/Output this shift:   No intake/output data recorded.   Intake/Output      09/23 0701 - 09/24 0700 09/24 0701 - 09/25 0700   P.O. 420    I.V. (mL/kg) 2958.8 (26.5)    IV Piggyback 0    Total Intake(mL/kg) 3378.8 (30.3)    Urine (mL/kg/hr) 2600    Emesis/NG output 0    Stool 0    Total Output 2600    Net +778.8         Urine Occurrence 2 x    Stool Occurrence 0 x    Emesis Occurrence 100 x       LABORATORY DATA: Recent Labs    05/24/18 0449  WBC 12.3*  HGB 13.7  HCT 41.6  PLT 184   Recent Labs    05/24/18 0449  NA 141  K 4.6  CL 107  CO2 27  BUN 15  CREATININE 0.89  GLUCOSE 132*  CALCIUM 9.1   No results found for: INR, PROTIME  Examination:  General appearance: alert, cooperative and no distress Extremities: extremities normal, atraumatic, no cyanosis or edema  Wound Exam: clean, dry, intact   Drainage:  None: wound tissue dry  Motor Exam: Quadriceps and Hamstrings Intact  Sensory Exam: Superficial Peroneal, Deep Peroneal and Tibial normal   Assessment:    1 Day Post-Op  Procedure(s) (LRB): RIGHT TOTAL KNEE ARTHROPLASTY (Right)  ADDITIONAL DIAGNOSIS:  Active Problems:   S/P total knee replacement     Plan: Physical Therapy as ordered Weight Bearing as Tolerated (WBAT)  DVT Prophylaxis:  Aspirin  DISCHARGE PLAN: Home  DISCHARGE NEEDS: HHPT     Patient doing well, expected D/C home today  Patient's anticipated LOS is less  than 2 midnights, meeting these requirements: - Lives within 1 hour of care - Has a competent adult at home to recover with post-op recover - NO history of  - Chronic pain requiring opiods  - Diabetes  - Coronary Artery Disease  - Heart failure  - Heart attack  - Stroke  - DVT/VTE  - Cardiac arrhythmia  - Respiratory Failure/COPD  - Renal failure  - Anemia  - Advanced Liver disease        Donia Ast 05/24/2018, 7:03 AM

## 2018-05-24 NOTE — Progress Notes (Signed)
Physical Therapy Treatment Patient Details Name: Quy Lotts MRN: [id number] DOB: 1951/08/02 Today's Date: 05/24/2018    History of Present Illness 67 YO male s/p R TKR on 9/23. PMH includes lumbar stenosis/spondylolisthesis/synovial cyst L4-L5, OA, HTN. surgical history includes lumbar fusion 2015, eye surgery, R shoulder surgery 2011.     PT Comments    POD # 1 pm session Assisted with amb a greater distance in hallway and practiced stairs with spouse.   Pt has met goals to D/C to home  Follow Up Recommendations  Follow surgeon's recommendation for DC plan and follow-up therapies;Supervision for mobility/OOB     Equipment Recommendations  None recommended by PT    Recommendations for Other Services       Precautions / Restrictions Precautions Precautions: Fall;Knee Restrictions Weight Bearing Restrictions: No Other Position/Activity Restrictions: WBAT     Mobility  Bed Mobility               General bed mobility comments: OOB in recliner  Transfers Overall transfer level: Needs assistance Equipment used: Rolling walker (2 wheeled) Transfers: Sit to/from Stand Sit to Stand: Min guard;Supervision         General transfer comment: one VC safety with turns and 2 VC's on proper hand placement  Ambulation/Gait Ambulation/Gait assistance: Supervision;Min guard Gait Distance (Feet): 115 Feet Assistive device: Rolling walker (2 wheeled) Gait Pattern/deviations: Step-to pattern;Antalgic;Decreased stance time - right;Decreased weight shift to right;Trunk flexed;Decreased stride length Gait velocity: decreased   General Gait Details: Min guard for safety. Verbal cuing for sequencing, placement in RW.   Stairs Stairs: Yes Stairs assistance: Supervision;Min guard Stair Management: One rail Left;Step to pattern;Forwards;With crutches Number of Stairs: 3 General stair comments: with spouse present for "hands on" instruction safe handling   Wheelchair  Mobility    Modified Rankin (Stroke Patients Only)       Balance                                            Cognition Arousal/Alertness: Awake/alert Behavior During Therapy: WFL for tasks assessed/performed Overall Cognitive Status: Within Functional Limits for tasks assessed                                        Exercises      General Comments        Pertinent Vitals/Pain Pain Assessment: 0-10 Pain Score: 4  Pain Location: R knee  Pain Descriptors / Indicators: Constant;Sore;Operative site guarding Pain Intervention(s): Monitored during session;Repositioned;Ice applied    Home Living                      Prior Function            PT Goals (current goals can now be found in the care plan section) Progress towards PT goals: Progressing toward goals    Frequency    7X/week      PT Plan Current plan remains appropriate    Co-evaluation              AM-PAC PT "6 Clicks" Daily Activity  Outcome Measure  Difficulty turning over in bed (including adjusting bedclothes, sheets and blankets)?: A Lot Difficulty moving from lying on back to sitting on the side of the bed? : A Lot Difficulty sitting  down on and standing up from a chair with arms (e.g., wheelchair, bedside commode, etc,.)?: A Lot Help needed moving to and from a bed to chair (including a wheelchair)?: A Lot Help needed walking in hospital room?: A Lot Help needed climbing 3-5 steps with a railing? : A Lot 6 Click Score: 12    End of Session Equipment Utilized During Treatment: Gait belt Activity Tolerance: Patient tolerated treatment well Patient left: in chair;with chair alarm set;with call bell/phone within reach;with family/visitor present;with SCD's reapplied Nurse Communication: Mobility status PT Visit Diagnosis: Other abnormalities of gait and mobility (R26.89);Difficulty in walking, not elsewhere classified (R26.2)     Time:  1310-1340 PT Time Calculation (min) (ACUTE ONLY): 30 min  Charges:  $Gait Training: 8-22 mins $Therapeutic Activity: 8-22 mins                     Rica Koyanagi  PTA Acute  Rehabilitation Services Pager      [phone] Office      [phone]

## 2018-05-24 NOTE — Care Management Note (Signed)
Case Management Note  Patient Details  Name: Jonathon Gonzales MRN: [id number] Date of Birth: [birth year]  Subjective/Objective:    Discharge planning, spoke with patient and spouse at bedside. Have chosen [facility] PT, evaluate and treat.              Action/Plan: Contacted [facility] for referral. Has DME. [phone]   Expected Discharge Date:                  Expected Discharge Plan:  [place]  In-House Referral:  NA  Discharge planning Services  CM Consult  Post Acute Care Choice:  Home Health Choice offered to:  Patient, Spouse  DME Arranged:  N/A DME Agency:  NA  HH Arranged:  PT HH Agency:  [facility] (formerly [employer])  Status of Service:  Completed, signed off  If discussed at [employer], dates discussed:    Additional Comments:  Guadalupe Maple, RN 05/24/2018, 11:03 AM

## 2018-05-24 NOTE — Op Note (Addendum)
TOTAL KNEE REPLACEMENT OPERATIVE NOTE:  05/23/2018  5:21 PM  PATIENT:  Jonathon Gonzales  67 y.o. male  PRE-OPERATIVE DIAGNOSIS:  OSTEOARTHRIS RIGHT KNEE  POST-OPERATIVE DIAGNOSIS:  OSTEOARTHRIS RIGHT KNEE  PROCEDURE:  Procedure(s): RIGHT TOTAL KNEE ARTHROPLASTY  SURGEON:  Surgeon(s): Vickey Huger, MD  PHYSICIAN ASSISTANT: Nehemiah Massed, PA-C  ANESTHESIA:   spinal  SPECIMEN: None  COUNTS:  Correct  TOURNIQUET:   Total Tourniquet Time Documented: Thigh (Right) - 51 minutes Total: Thigh (Right) - 51 minutes   DICTATION:  Indication for procedure:    The patient is a 67 y.o. male who has failed conservative treatment for OSTEOARTHRIS RIGHT KNEE.  Informed consent was obtained prior to anesthesia. The risks versus benefits of the operation were explain and in a way the patient can, and did, understand.   On the implant demand matching protocol, this patient scored 10.  Therefore, this patient was not receive a polyethylene insert with vitamin E which is a high demand implant.  Description of procedure:     The patient was taken to the operating room and placed under anesthesia.  The patient was positioned in the usual fashion taking care that all body parts were adequately padded and/or protected.  A tourniquet was applied and the leg prepped and draped in the usual sterile fashion.  The extremity was exsanguinated with the esmarch and tourniquet inflated to 350 mmHg.  Pre-operative range of motion was normal.  The knee was in 5 degree of mild valgus.  A midline incision approximately 6-7 inches long was made with a #10 blade.  A new blade was used to make a parapatellar arthrotomy going 2-3 cm into the quadriceps tendon, over the patella, and alongside the medial aspect of the patellar tendon.  A synovectomy was then performed with the #10 blade and forceps. I then elevated the deep MCL off the medial tibial metaphysis subperiosteally around to the semimembranosus attachment.     I everted the patella and used calipers to measure patellar thickness.  I used the reamer to ream down to appropriate thickness to recreate the native thickness.  I then removed excess bone with the rongeur and sagittal saw.  I used the appropriately sized template and drilled the three lug holes.  I then put the trial in place and measured the thickness with the calipers to ensure recreation of the native thickness.  The trial was then removed and the patella subluxed and the knee brought into flexion.  A homan retractor was place to retract and protect the patella and lateral structures.  A Z-retractor was place medially to protect the medial structures.  The extra-medullary alignment system was used to make cut the tibial articular surface perpendicular to the anamotic axis of the tibia and in 3 degrees of posterior slope.  The cut surface and alignment jig was removed.  I then used the intramedullary alignment guide to make a 5 valgus cut on the distal femur.  I then marked out the epicondylar axis on the distal femur.  The posterior condylar axis measured 5 degrees.  I then used the anterior referencing sizer and measured the femur to be a size 10.  The 4-In-1 cutting block was screwed into place in external rotation matching the posterior condylar angle, making our cuts perpendicular to the epicondylar axis.  Anterior, posterior and chamfer cuts were made with the sagittal saw.  The cutting block and cut pieces were removed.  A lamina spreader was placed in 90 degrees of flexion.  The ACL, PCL, menisci, and posterior condylar osteophytes were removed.  A 10 mm spacer blocked was found to offer good flexion and extension gap balance after mild in degree releasing.   The scoop retractor was then placed and the femoral finishing block was pinned in place.  The small sagittal saw was used as well as the lug drill to finish the femur.  The block and cut surfaces were removed and the medullary canal hole  filled with autograft bone from the cut pieces.  The tibia was delivered forward in deep flexion and external rotation.  A size F tray was selected and pinned into place centered on the medial 1/3 of the tibial tubercle.  The reamer and keel was used to prepare the tibia through the tray.    I then trialed with the size 10 femur, size F tibia, a 10 mm insert and the 35 patella.  I had excellent flexion/extension gap balance, excellent patella tracking.  Flexion was full and beyond 120 degrees; extension was zero.  These components were chosen and the staff opened them to me on the back table while the knee was lavaged copiously and the cement mixed.  The soft tissue was infiltrated with 60cc of exparel 1.3% through a 21 gauge needle.  I cemented in the components and removed all excess cement.  The polyethylene tibial component was snapped into place and the knee placed in extension while cement was hardening.  The capsule was infilltrated with a 60cc exparel/marcaine/saline mixture.   Once the cement was hard, the tourniquet was let down.  Hemostasis was obtained.  The arthrotomy was closed using a #1 stratofix running suture.  The deep soft tissues were closed with #0 vicryls and the subcuticular layer closed with #2-0 vicryl.  The skin was reapproximated and closed with 3.0 Monocryl.  The wound was covered with steristrips, aquacel dressing, and a TED stocking.   The patient was then awakened, extubated, and taken to the recovery room in stable condition.  BLOOD LOSS:  371IR COMPLICATIONS:  None.  PLAN OF CARE: Admit for overnight observation  PATIENT DISPOSITION:  PACU - hemodynamically stable.   Delay start of Pharmacological VTE agent (>24hrs) due to surgical blood loss or risk of bleeding:  not applicable  Please fax a copy of this op note to my office at [phone] (please only include page 1 and 2 of the Case Information op note)

## 2018-05-24 NOTE — Progress Notes (Signed)
Physical Therapy Treatment Patient Details Name: Jonathon Gonzales MRN: [id number] DOB: [birth year] Today's Date: 05/24/2018    History of Present Illness 67 YO male s/p R TKR on 9/23. PMH includes lumbar stenosis/spondylolisthesis/synovial cyst L4-L5, OA, HTN. surgical history includes lumbar fusion 2015, eye surgery, R shoulder surgery 2011.     PT Comments    POD # 1 am session assisted with amb an increased distance and performed TKR TE's followed by ICE   Follow Up Recommendations  Follow surgeon's recommendation for DC plan and follow-up therapies;Supervision for mobility/OOB     Equipment Recommendations  None recommended by PT    Recommendations for Other Services       Precautions / Restrictions Precautions Precautions: Fall;Knee Restrictions Weight Bearing Restrictions: No Other Position/Activity Restrictions: WBAT     Mobility  Bed Mobility               General bed mobility comments: OOB in recliner  Transfers Overall transfer level: Needs assistance Equipment used: Rolling walker (2 wheeled) Transfers: Sit to/from Stand Sit to Stand: Min guard;Supervision         General transfer comment: one VC safety with turns and 2 VC's on proper hand placement  Ambulation/Gait Ambulation/Gait assistance: Supervision;Min guard Gait Distance (Feet): 75 Feet Assistive device: Rolling walker (2 wheeled) Gait Pattern/deviations: Step-to pattern;Antalgic;Decreased stance time - right;Decreased weight shift to right;Trunk flexed;Decreased stride length Gait velocity: decreased   General Gait Details: Min guard for safety. Verbal cuing for sequencing, placement in RW.   Stairs             Wheelchair Mobility    Modified Rankin (Stroke Patients Only)       Balance                                            Cognition Arousal/Alertness: Awake/alert Behavior During Therapy: WFL for tasks assessed/performed Overall Cognitive Status:  Within Functional Limits for tasks assessed                                        Exercises   Total Knee Replacement TE's 10 reps B LE ankle pumps 10 reps towel squeezes 10 reps knee presses 10 reps heel slides  10 reps SAQ's 10 reps SLR's 10 reps ABD Followed by ICE     General Comments        Pertinent Vitals/Pain Pain Assessment: 0-10 Pain Score: 4  Pain Location: R knee  Pain Descriptors / Indicators: Constant;Sore;Operative site guarding Pain Intervention(s): Monitored during session;Repositioned;Ice applied    Home Living                      Prior Function            PT Goals (current goals can now be found in the care plan section) Progress towards PT goals: Progressing toward goals    Frequency    7X/week      PT Plan Current plan remains appropriate    Co-evaluation              AM-PAC PT "6 Clicks" Daily Activity  Outcome Measure  Difficulty turning over in bed (including adjusting bedclothes, sheets and blankets)?: A Lot Difficulty moving from lying on back to sitting on the side of  the bed? : A Lot Difficulty sitting down on and standing up from a chair with arms (e.g., wheelchair, bedside commode, etc,.)?: A Lot Help needed moving to and from a bed to chair (including a wheelchair)?: A Lot Help needed walking in hospital room?: A Lot Help needed climbing 3-5 steps with a railing? : A Lot 6 Click Score: 12    End of Session Equipment Utilized During Treatment: Gait belt Activity Tolerance: Patient tolerated treatment well Patient left: in chair;with chair alarm set;with call bell/phone within reach;with family/visitor present;with SCD's reapplied Nurse Communication: Mobility status PT Visit Diagnosis: Other abnormalities of gait and mobility (R26.89);Difficulty in walking, not elsewhere classified (R26.2)     Time: 1020-1045 PT Time Calculation (min) (ACUTE ONLY): 25 min  Charges:  $Gait Training:  8-22 mins $Therapeutic Exercise: 8-22 mins                     Rica Koyanagi  PTA Acute  Rehabilitation Services Pager      [phone] Office      [phone]

## 2018-05-25 DIAGNOSIS — Z87891 Personal history of nicotine dependence: Secondary | ICD-10-CM | POA: Diagnosis not present

## 2018-05-25 DIAGNOSIS — Z96651 Presence of right artificial knee joint: Secondary | ICD-10-CM | POA: Diagnosis not present

## 2018-05-25 DIAGNOSIS — Z9181 History of falling: Secondary | ICD-10-CM | POA: Diagnosis not present

## 2018-05-25 DIAGNOSIS — I10 Essential (primary) hypertension: Secondary | ICD-10-CM | POA: Diagnosis not present

## 2018-05-25 DIAGNOSIS — Z7982 Long term (current) use of aspirin: Secondary | ICD-10-CM | POA: Diagnosis not present

## 2018-05-25 DIAGNOSIS — Z471 Aftercare following joint replacement surgery: Secondary | ICD-10-CM | POA: Diagnosis not present

## 2018-05-26 DIAGNOSIS — Z471 Aftercare following joint replacement surgery: Secondary | ICD-10-CM | POA: Diagnosis not present

## 2018-05-26 DIAGNOSIS — Z96651 Presence of right artificial knee joint: Secondary | ICD-10-CM | POA: Diagnosis not present

## 2018-05-26 DIAGNOSIS — I10 Essential (primary) hypertension: Secondary | ICD-10-CM | POA: Diagnosis not present

## 2018-05-26 DIAGNOSIS — Z7982 Long term (current) use of aspirin: Secondary | ICD-10-CM | POA: Diagnosis not present

## 2018-05-26 DIAGNOSIS — Z9181 History of falling: Secondary | ICD-10-CM | POA: Diagnosis not present

## 2018-05-26 DIAGNOSIS — Z87891 Personal history of nicotine dependence: Secondary | ICD-10-CM | POA: Diagnosis not present

## 2018-05-27 DIAGNOSIS — Z7982 Long term (current) use of aspirin: Secondary | ICD-10-CM | POA: Diagnosis not present

## 2018-05-27 DIAGNOSIS — Z87891 Personal history of nicotine dependence: Secondary | ICD-10-CM | POA: Diagnosis not present

## 2018-05-27 DIAGNOSIS — Z471 Aftercare following joint replacement surgery: Secondary | ICD-10-CM | POA: Diagnosis not present

## 2018-05-27 DIAGNOSIS — Z96651 Presence of right artificial knee joint: Secondary | ICD-10-CM | POA: Diagnosis not present

## 2018-05-27 DIAGNOSIS — I10 Essential (primary) hypertension: Secondary | ICD-10-CM | POA: Diagnosis not present

## 2018-05-27 DIAGNOSIS — Z9181 History of falling: Secondary | ICD-10-CM | POA: Diagnosis not present

## 2018-05-30 DIAGNOSIS — Z471 Aftercare following joint replacement surgery: Secondary | ICD-10-CM | POA: Diagnosis not present

## 2018-05-30 DIAGNOSIS — Z9181 History of falling: Secondary | ICD-10-CM | POA: Diagnosis not present

## 2018-05-30 DIAGNOSIS — Z87891 Personal history of nicotine dependence: Secondary | ICD-10-CM | POA: Diagnosis not present

## 2018-05-30 DIAGNOSIS — Z7982 Long term (current) use of aspirin: Secondary | ICD-10-CM | POA: Diagnosis not present

## 2018-05-30 DIAGNOSIS — I10 Essential (primary) hypertension: Secondary | ICD-10-CM | POA: Diagnosis not present

## 2018-05-30 DIAGNOSIS — Z96651 Presence of right artificial knee joint: Secondary | ICD-10-CM | POA: Diagnosis not present

## 2018-06-01 DIAGNOSIS — Z9181 History of falling: Secondary | ICD-10-CM | POA: Diagnosis not present

## 2018-06-01 DIAGNOSIS — I10 Essential (primary) hypertension: Secondary | ICD-10-CM | POA: Diagnosis not present

## 2018-06-01 DIAGNOSIS — Z96651 Presence of right artificial knee joint: Secondary | ICD-10-CM | POA: Diagnosis not present

## 2018-06-01 DIAGNOSIS — Z7982 Long term (current) use of aspirin: Secondary | ICD-10-CM | POA: Diagnosis not present

## 2018-06-01 DIAGNOSIS — Z471 Aftercare following joint replacement surgery: Secondary | ICD-10-CM | POA: Diagnosis not present

## 2018-06-01 DIAGNOSIS — Z87891 Personal history of nicotine dependence: Secondary | ICD-10-CM | POA: Diagnosis not present

## 2018-06-02 DIAGNOSIS — Z96651 Presence of right artificial knee joint: Secondary | ICD-10-CM | POA: Diagnosis not present

## 2018-06-03 DIAGNOSIS — Z96651 Presence of right artificial knee joint: Secondary | ICD-10-CM | POA: Diagnosis not present

## 2018-06-03 DIAGNOSIS — M25561 Pain in right knee: Secondary | ICD-10-CM | POA: Diagnosis not present

## 2018-06-03 DIAGNOSIS — M25661 Stiffness of right knee, not elsewhere classified: Secondary | ICD-10-CM | POA: Diagnosis not present

## 2018-06-03 DIAGNOSIS — M1711 Unilateral primary osteoarthritis, right knee: Secondary | ICD-10-CM | POA: Diagnosis not present

## 2018-06-03 DIAGNOSIS — R2689 Other abnormalities of gait and mobility: Secondary | ICD-10-CM | POA: Diagnosis not present

## 2018-06-08 DIAGNOSIS — M25661 Stiffness of right knee, not elsewhere classified: Secondary | ICD-10-CM | POA: Diagnosis not present

## 2018-06-08 DIAGNOSIS — M25561 Pain in right knee: Secondary | ICD-10-CM | POA: Diagnosis not present

## 2018-06-08 DIAGNOSIS — R2689 Other abnormalities of gait and mobility: Secondary | ICD-10-CM | POA: Diagnosis not present

## 2018-06-08 DIAGNOSIS — Z96651 Presence of right artificial knee joint: Secondary | ICD-10-CM | POA: Diagnosis not present

## 2018-06-08 DIAGNOSIS — M1711 Unilateral primary osteoarthritis, right knee: Secondary | ICD-10-CM | POA: Diagnosis not present

## 2018-06-10 DIAGNOSIS — Z96651 Presence of right artificial knee joint: Secondary | ICD-10-CM | POA: Diagnosis not present

## 2018-06-10 DIAGNOSIS — R2689 Other abnormalities of gait and mobility: Secondary | ICD-10-CM | POA: Diagnosis not present

## 2018-06-10 DIAGNOSIS — M25561 Pain in right knee: Secondary | ICD-10-CM | POA: Diagnosis not present

## 2018-06-10 DIAGNOSIS — M1711 Unilateral primary osteoarthritis, right knee: Secondary | ICD-10-CM | POA: Diagnosis not present

## 2018-06-10 DIAGNOSIS — M25661 Stiffness of right knee, not elsewhere classified: Secondary | ICD-10-CM | POA: Diagnosis not present

## 2018-06-15 DIAGNOSIS — M1711 Unilateral primary osteoarthritis, right knee: Secondary | ICD-10-CM | POA: Diagnosis not present

## 2018-06-15 DIAGNOSIS — M25661 Stiffness of right knee, not elsewhere classified: Secondary | ICD-10-CM | POA: Diagnosis not present

## 2018-06-15 DIAGNOSIS — Z96651 Presence of right artificial knee joint: Secondary | ICD-10-CM | POA: Diagnosis not present

## 2018-06-15 DIAGNOSIS — M25561 Pain in right knee: Secondary | ICD-10-CM | POA: Diagnosis not present

## 2018-06-15 DIAGNOSIS — R2689 Other abnormalities of gait and mobility: Secondary | ICD-10-CM | POA: Diagnosis not present

## 2018-06-17 DIAGNOSIS — Z96651 Presence of right artificial knee joint: Secondary | ICD-10-CM | POA: Diagnosis not present

## 2018-06-17 DIAGNOSIS — M25561 Pain in right knee: Secondary | ICD-10-CM | POA: Diagnosis not present

## 2018-06-17 DIAGNOSIS — M25661 Stiffness of right knee, not elsewhere classified: Secondary | ICD-10-CM | POA: Diagnosis not present

## 2018-06-17 DIAGNOSIS — M1711 Unilateral primary osteoarthritis, right knee: Secondary | ICD-10-CM | POA: Diagnosis not present

## 2018-06-17 DIAGNOSIS — R2689 Other abnormalities of gait and mobility: Secondary | ICD-10-CM | POA: Diagnosis not present

## 2018-06-22 DIAGNOSIS — R2689 Other abnormalities of gait and mobility: Secondary | ICD-10-CM | POA: Diagnosis not present

## 2018-06-22 DIAGNOSIS — M25561 Pain in right knee: Secondary | ICD-10-CM | POA: Diagnosis not present

## 2018-06-22 DIAGNOSIS — M1711 Unilateral primary osteoarthritis, right knee: Secondary | ICD-10-CM | POA: Diagnosis not present

## 2018-06-22 DIAGNOSIS — Z96651 Presence of right artificial knee joint: Secondary | ICD-10-CM | POA: Diagnosis not present

## 2018-06-22 DIAGNOSIS — M25661 Stiffness of right knee, not elsewhere classified: Secondary | ICD-10-CM | POA: Diagnosis not present

## 2018-06-24 DIAGNOSIS — Z96651 Presence of right artificial knee joint: Secondary | ICD-10-CM | POA: Diagnosis not present

## 2018-06-24 DIAGNOSIS — M25561 Pain in right knee: Secondary | ICD-10-CM | POA: Diagnosis not present

## 2018-06-24 DIAGNOSIS — R2689 Other abnormalities of gait and mobility: Secondary | ICD-10-CM | POA: Diagnosis not present

## 2018-06-24 DIAGNOSIS — M1711 Unilateral primary osteoarthritis, right knee: Secondary | ICD-10-CM | POA: Diagnosis not present

## 2018-06-24 DIAGNOSIS — M25661 Stiffness of right knee, not elsewhere classified: Secondary | ICD-10-CM | POA: Diagnosis not present

## 2018-06-29 DIAGNOSIS — M25661 Stiffness of right knee, not elsewhere classified: Secondary | ICD-10-CM | POA: Diagnosis not present

## 2018-06-29 DIAGNOSIS — R2689 Other abnormalities of gait and mobility: Secondary | ICD-10-CM | POA: Diagnosis not present

## 2018-06-29 DIAGNOSIS — M1711 Unilateral primary osteoarthritis, right knee: Secondary | ICD-10-CM | POA: Diagnosis not present

## 2018-06-29 DIAGNOSIS — Z96651 Presence of right artificial knee joint: Secondary | ICD-10-CM | POA: Diagnosis not present

## 2018-06-29 DIAGNOSIS — M25561 Pain in right knee: Secondary | ICD-10-CM | POA: Diagnosis not present

## 2018-07-01 DIAGNOSIS — M25561 Pain in right knee: Secondary | ICD-10-CM | POA: Diagnosis not present

## 2018-07-01 DIAGNOSIS — Z96651 Presence of right artificial knee joint: Secondary | ICD-10-CM | POA: Diagnosis not present

## 2018-07-01 DIAGNOSIS — M25661 Stiffness of right knee, not elsewhere classified: Secondary | ICD-10-CM | POA: Diagnosis not present

## 2018-07-01 DIAGNOSIS — R2689 Other abnormalities of gait and mobility: Secondary | ICD-10-CM | POA: Diagnosis not present

## 2018-07-01 DIAGNOSIS — M1711 Unilateral primary osteoarthritis, right knee: Secondary | ICD-10-CM | POA: Diagnosis not present

## 2018-07-06 DIAGNOSIS — M1711 Unilateral primary osteoarthritis, right knee: Secondary | ICD-10-CM | POA: Diagnosis not present

## 2018-07-06 DIAGNOSIS — M25661 Stiffness of right knee, not elsewhere classified: Secondary | ICD-10-CM | POA: Diagnosis not present

## 2018-07-06 DIAGNOSIS — R2689 Other abnormalities of gait and mobility: Secondary | ICD-10-CM | POA: Diagnosis not present

## 2018-07-06 DIAGNOSIS — Z96651 Presence of right artificial knee joint: Secondary | ICD-10-CM | POA: Diagnosis not present

## 2018-07-06 DIAGNOSIS — M25561 Pain in right knee: Secondary | ICD-10-CM | POA: Diagnosis not present

## 2018-07-08 DIAGNOSIS — M1711 Unilateral primary osteoarthritis, right knee: Secondary | ICD-10-CM | POA: Diagnosis not present

## 2018-07-08 DIAGNOSIS — M25661 Stiffness of right knee, not elsewhere classified: Secondary | ICD-10-CM | POA: Diagnosis not present

## 2018-07-08 DIAGNOSIS — M25561 Pain in right knee: Secondary | ICD-10-CM | POA: Diagnosis not present

## 2018-07-08 DIAGNOSIS — R2689 Other abnormalities of gait and mobility: Secondary | ICD-10-CM | POA: Diagnosis not present

## 2018-07-08 DIAGNOSIS — Z96651 Presence of right artificial knee joint: Secondary | ICD-10-CM | POA: Diagnosis not present

## 2018-07-11 DIAGNOSIS — M25561 Pain in right knee: Secondary | ICD-10-CM | POA: Diagnosis not present

## 2018-07-11 DIAGNOSIS — Z96651 Presence of right artificial knee joint: Secondary | ICD-10-CM | POA: Diagnosis not present

## 2018-07-11 DIAGNOSIS — M25661 Stiffness of right knee, not elsewhere classified: Secondary | ICD-10-CM | POA: Diagnosis not present

## 2018-07-11 DIAGNOSIS — R2689 Other abnormalities of gait and mobility: Secondary | ICD-10-CM | POA: Diagnosis not present

## 2018-07-11 DIAGNOSIS — M1711 Unilateral primary osteoarthritis, right knee: Secondary | ICD-10-CM | POA: Diagnosis not present

## 2018-07-13 DIAGNOSIS — M25561 Pain in right knee: Secondary | ICD-10-CM | POA: Diagnosis not present

## 2018-07-13 DIAGNOSIS — M1711 Unilateral primary osteoarthritis, right knee: Secondary | ICD-10-CM | POA: Diagnosis not present

## 2018-07-13 DIAGNOSIS — M25661 Stiffness of right knee, not elsewhere classified: Secondary | ICD-10-CM | POA: Diagnosis not present

## 2018-07-13 DIAGNOSIS — Z96651 Presence of right artificial knee joint: Secondary | ICD-10-CM | POA: Diagnosis not present

## 2018-07-13 DIAGNOSIS — R2689 Other abnormalities of gait and mobility: Secondary | ICD-10-CM | POA: Diagnosis not present

## 2018-07-19 DIAGNOSIS — M25661 Stiffness of right knee, not elsewhere classified: Secondary | ICD-10-CM | POA: Diagnosis not present

## 2018-07-19 DIAGNOSIS — Z96651 Presence of right artificial knee joint: Secondary | ICD-10-CM | POA: Diagnosis not present

## 2018-07-19 DIAGNOSIS — M25561 Pain in right knee: Secondary | ICD-10-CM | POA: Diagnosis not present

## 2018-07-19 DIAGNOSIS — R2689 Other abnormalities of gait and mobility: Secondary | ICD-10-CM | POA: Diagnosis not present

## 2018-07-19 DIAGNOSIS — M1711 Unilateral primary osteoarthritis, right knee: Secondary | ICD-10-CM | POA: Diagnosis not present

## 2018-07-21 DIAGNOSIS — Z96651 Presence of right artificial knee joint: Secondary | ICD-10-CM | POA: Diagnosis not present

## 2018-07-21 DIAGNOSIS — M25661 Stiffness of right knee, not elsewhere classified: Secondary | ICD-10-CM | POA: Diagnosis not present

## 2018-07-21 DIAGNOSIS — M1711 Unilateral primary osteoarthritis, right knee: Secondary | ICD-10-CM | POA: Diagnosis not present

## 2018-07-21 DIAGNOSIS — R2689 Other abnormalities of gait and mobility: Secondary | ICD-10-CM | POA: Diagnosis not present

## 2018-07-21 DIAGNOSIS — M25561 Pain in right knee: Secondary | ICD-10-CM | POA: Diagnosis not present

## 2018-07-26 DIAGNOSIS — R2689 Other abnormalities of gait and mobility: Secondary | ICD-10-CM | POA: Diagnosis not present

## 2018-07-26 DIAGNOSIS — M25661 Stiffness of right knee, not elsewhere classified: Secondary | ICD-10-CM | POA: Diagnosis not present

## 2018-07-26 DIAGNOSIS — Z96651 Presence of right artificial knee joint: Secondary | ICD-10-CM | POA: Diagnosis not present

## 2018-07-26 DIAGNOSIS — M1711 Unilateral primary osteoarthritis, right knee: Secondary | ICD-10-CM | POA: Diagnosis not present

## 2018-07-26 DIAGNOSIS — M25561 Pain in right knee: Secondary | ICD-10-CM | POA: Diagnosis not present

## 2018-08-03 DIAGNOSIS — M25661 Stiffness of right knee, not elsewhere classified: Secondary | ICD-10-CM | POA: Diagnosis not present

## 2018-08-03 DIAGNOSIS — M1711 Unilateral primary osteoarthritis, right knee: Secondary | ICD-10-CM | POA: Diagnosis not present

## 2018-08-03 DIAGNOSIS — Z96651 Presence of right artificial knee joint: Secondary | ICD-10-CM | POA: Diagnosis not present

## 2018-08-03 DIAGNOSIS — R2689 Other abnormalities of gait and mobility: Secondary | ICD-10-CM | POA: Diagnosis not present

## 2018-08-03 DIAGNOSIS — M25561 Pain in right knee: Secondary | ICD-10-CM | POA: Diagnosis not present

## 2018-08-09 DIAGNOSIS — Z23 Encounter for immunization: Secondary | ICD-10-CM | POA: Diagnosis not present

## 2018-09-08 DIAGNOSIS — Z96651 Presence of right artificial knee joint: Secondary | ICD-10-CM | POA: Diagnosis not present

## 2018-09-08 DIAGNOSIS — Z471 Aftercare following joint replacement surgery: Secondary | ICD-10-CM | POA: Diagnosis not present

## 2018-10-06 DIAGNOSIS — Z0000 Encounter for general adult medical examination without abnormal findings: Secondary | ICD-10-CM | POA: Diagnosis not present

## 2018-10-06 DIAGNOSIS — Z6832 Body mass index (BMI) 32.0-32.9, adult: Secondary | ICD-10-CM | POA: Diagnosis not present

## 2018-10-06 DIAGNOSIS — I10 Essential (primary) hypertension: Secondary | ICD-10-CM | POA: Diagnosis not present

## 2019-01-26 DIAGNOSIS — M25461 Effusion, right knee: Secondary | ICD-10-CM | POA: Diagnosis not present

## 2019-01-26 DIAGNOSIS — Z96651 Presence of right artificial knee joint: Secondary | ICD-10-CM | POA: Diagnosis not present

## 2019-03-22 DIAGNOSIS — M1990 Unspecified osteoarthritis, unspecified site: Secondary | ICD-10-CM | POA: Diagnosis not present

## 2019-03-22 DIAGNOSIS — D519 Vitamin B12 deficiency anemia, unspecified: Secondary | ICD-10-CM | POA: Diagnosis not present

## 2019-03-22 DIAGNOSIS — Z6831 Body mass index (BMI) 31.0-31.9, adult: Secondary | ICD-10-CM | POA: Diagnosis not present

## 2019-03-22 DIAGNOSIS — Z23 Encounter for immunization: Secondary | ICD-10-CM | POA: Diagnosis not present

## 2019-03-22 DIAGNOSIS — Z125 Encounter for screening for malignant neoplasm of prostate: Secondary | ICD-10-CM | POA: Diagnosis not present

## 2019-03-22 DIAGNOSIS — M545 Low back pain: Secondary | ICD-10-CM | POA: Diagnosis not present

## 2019-03-22 DIAGNOSIS — M5136 Other intervertebral disc degeneration, lumbar region: Secondary | ICD-10-CM | POA: Diagnosis not present

## 2019-03-22 DIAGNOSIS — I10 Essential (primary) hypertension: Secondary | ICD-10-CM | POA: Diagnosis not present

## 2019-03-22 DIAGNOSIS — Z79899 Other long term (current) drug therapy: Secondary | ICD-10-CM | POA: Diagnosis not present

## 2019-03-22 DIAGNOSIS — M5416 Radiculopathy, lumbar region: Secondary | ICD-10-CM | POA: Diagnosis not present

## 2019-03-27 DIAGNOSIS — H25811 Combined forms of age-related cataract, right eye: Secondary | ICD-10-CM | POA: Diagnosis not present

## 2019-03-27 DIAGNOSIS — Z961 Presence of intraocular lens: Secondary | ICD-10-CM | POA: Diagnosis not present

## 2019-03-29 ENCOUNTER — Other Ambulatory Visit: Payer: Self-pay

## 2019-06-15 DIAGNOSIS — Z23 Encounter for immunization: Secondary | ICD-10-CM | POA: Diagnosis not present

## 2019-09-26 DIAGNOSIS — R519 Headache, unspecified: Secondary | ICD-10-CM | POA: Diagnosis not present

## 2019-09-26 DIAGNOSIS — R509 Fever, unspecified: Secondary | ICD-10-CM | POA: Diagnosis not present

## 2019-09-26 DIAGNOSIS — Z20828 Contact with and (suspected) exposure to other viral communicable diseases: Secondary | ICD-10-CM | POA: Diagnosis not present

## 2020-02-01 DIAGNOSIS — M79642 Pain in left hand: Secondary | ICD-10-CM | POA: Diagnosis not present

## 2020-02-01 DIAGNOSIS — M79641 Pain in right hand: Secondary | ICD-10-CM | POA: Diagnosis not present

## 2020-03-27 DIAGNOSIS — G5603 Carpal tunnel syndrome, bilateral upper limbs: Secondary | ICD-10-CM | POA: Diagnosis not present

## 2020-04-16 DIAGNOSIS — L720 Epidermal cyst: Secondary | ICD-10-CM | POA: Diagnosis not present

## 2020-04-16 DIAGNOSIS — X32XXXS Exposure to sunlight, sequela: Secondary | ICD-10-CM | POA: Diagnosis not present

## 2020-04-16 DIAGNOSIS — L821 Other seborrheic keratosis: Secondary | ICD-10-CM | POA: Diagnosis not present

## 2020-04-16 DIAGNOSIS — Z1839 Other retained organic fragments: Secondary | ICD-10-CM | POA: Diagnosis not present

## 2020-04-16 DIAGNOSIS — L570 Actinic keratosis: Secondary | ICD-10-CM | POA: Diagnosis not present

## 2020-04-16 DIAGNOSIS — L905 Scar conditions and fibrosis of skin: Secondary | ICD-10-CM | POA: Diagnosis not present

## 2020-04-16 DIAGNOSIS — L814 Other melanin hyperpigmentation: Secondary | ICD-10-CM | POA: Diagnosis not present

## 2020-04-16 DIAGNOSIS — L578 Other skin changes due to chronic exposure to nonionizing radiation: Secondary | ICD-10-CM | POA: Diagnosis not present

## 2020-04-16 DIAGNOSIS — L923 Foreign body granuloma of the skin and subcutaneous tissue: Secondary | ICD-10-CM | POA: Diagnosis not present

## 2020-05-09 DIAGNOSIS — G5603 Carpal tunnel syndrome, bilateral upper limbs: Secondary | ICD-10-CM | POA: Diagnosis not present

## 2020-07-23 DIAGNOSIS — Z1331 Encounter for screening for depression: Secondary | ICD-10-CM | POA: Diagnosis not present

## 2020-07-23 DIAGNOSIS — Z0000 Encounter for general adult medical examination without abnormal findings: Secondary | ICD-10-CM | POA: Diagnosis not present

## 2020-07-23 DIAGNOSIS — Z23 Encounter for immunization: Secondary | ICD-10-CM | POA: Diagnosis not present

## 2020-07-23 DIAGNOSIS — I10 Essential (primary) hypertension: Secondary | ICD-10-CM | POA: Diagnosis not present

## 2020-07-23 DIAGNOSIS — Z6831 Body mass index (BMI) 31.0-31.9, adult: Secondary | ICD-10-CM | POA: Diagnosis not present

## 2020-07-23 DIAGNOSIS — Z125 Encounter for screening for malignant neoplasm of prostate: Secondary | ICD-10-CM | POA: Diagnosis not present

## 2020-07-23 DIAGNOSIS — M1990 Unspecified osteoarthritis, unspecified site: Secondary | ICD-10-CM | POA: Diagnosis not present

## 2020-07-23 DIAGNOSIS — Z79899 Other long term (current) drug therapy: Secondary | ICD-10-CM | POA: Diagnosis not present

## 2020-08-29 DIAGNOSIS — I10 Essential (primary) hypertension: Secondary | ICD-10-CM | POA: Diagnosis not present

## 2020-08-29 DIAGNOSIS — K219 Gastro-esophageal reflux disease without esophagitis: Secondary | ICD-10-CM | POA: Diagnosis not present

## 2020-08-29 DIAGNOSIS — D519 Vitamin B12 deficiency anemia, unspecified: Secondary | ICD-10-CM | POA: Diagnosis not present

## 2020-09-20 DIAGNOSIS — G5601 Carpal tunnel syndrome, right upper limb: Secondary | ICD-10-CM | POA: Diagnosis not present

## 2020-12-19 DIAGNOSIS — L720 Epidermal cyst: Secondary | ICD-10-CM | POA: Diagnosis not present

## 2021-01-23 DIAGNOSIS — D485 Neoplasm of uncertain behavior of skin: Secondary | ICD-10-CM | POA: Diagnosis not present

## 2021-01-23 DIAGNOSIS — L720 Epidermal cyst: Secondary | ICD-10-CM | POA: Diagnosis not present

## 2021-03-30 DIAGNOSIS — I10 Essential (primary) hypertension: Secondary | ICD-10-CM | POA: Diagnosis not present

## 2021-03-30 DIAGNOSIS — D510 Vitamin B12 deficiency anemia due to intrinsic factor deficiency: Secondary | ICD-10-CM | POA: Diagnosis not present

## 2021-03-30 DIAGNOSIS — K219 Gastro-esophageal reflux disease without esophagitis: Secondary | ICD-10-CM | POA: Diagnosis not present

## 2021-04-15 DIAGNOSIS — L720 Epidermal cyst: Secondary | ICD-10-CM | POA: Diagnosis not present

## 2021-04-15 DIAGNOSIS — L578 Other skin changes due to chronic exposure to nonionizing radiation: Secondary | ICD-10-CM | POA: Diagnosis not present

## 2021-04-15 DIAGNOSIS — L570 Actinic keratosis: Secondary | ICD-10-CM | POA: Diagnosis not present

## 2021-04-15 DIAGNOSIS — X32XXXS Exposure to sunlight, sequela: Secondary | ICD-10-CM | POA: Diagnosis not present

## 2021-04-15 DIAGNOSIS — L814 Other melanin hyperpigmentation: Secondary | ICD-10-CM | POA: Diagnosis not present

## 2021-04-15 DIAGNOSIS — L821 Other seborrheic keratosis: Secondary | ICD-10-CM | POA: Diagnosis not present

## 2021-04-23 DIAGNOSIS — M5136 Other intervertebral disc degeneration, lumbar region: Secondary | ICD-10-CM | POA: Diagnosis not present

## 2021-04-23 DIAGNOSIS — I10 Essential (primary) hypertension: Secondary | ICD-10-CM | POA: Diagnosis not present

## 2021-04-23 DIAGNOSIS — Z6831 Body mass index (BMI) 31.0-31.9, adult: Secondary | ICD-10-CM | POA: Diagnosis not present

## 2021-04-23 DIAGNOSIS — M5416 Radiculopathy, lumbar region: Secondary | ICD-10-CM | POA: Diagnosis not present

## 2021-04-23 DIAGNOSIS — Z13828 Encounter for screening for other musculoskeletal disorder: Secondary | ICD-10-CM | POA: Diagnosis not present

## 2021-04-23 DIAGNOSIS — M4316 Spondylolisthesis, lumbar region: Secondary | ICD-10-CM | POA: Diagnosis not present

## 2021-05-01 DIAGNOSIS — D485 Neoplasm of uncertain behavior of skin: Secondary | ICD-10-CM | POA: Diagnosis not present

## 2021-05-01 DIAGNOSIS — L720 Epidermal cyst: Secondary | ICD-10-CM | POA: Diagnosis not present

## 2021-05-09 DIAGNOSIS — M5126 Other intervertebral disc displacement, lumbar region: Secondary | ICD-10-CM | POA: Diagnosis not present

## 2021-05-09 DIAGNOSIS — M5416 Radiculopathy, lumbar region: Secondary | ICD-10-CM | POA: Diagnosis not present

## 2021-05-14 DIAGNOSIS — I10 Essential (primary) hypertension: Secondary | ICD-10-CM | POA: Diagnosis not present

## 2021-05-14 DIAGNOSIS — M5136 Other intervertebral disc degeneration, lumbar region: Secondary | ICD-10-CM | POA: Diagnosis not present

## 2021-05-14 DIAGNOSIS — M48062 Spinal stenosis, lumbar region with neurogenic claudication: Secondary | ICD-10-CM | POA: Diagnosis not present

## 2021-05-14 DIAGNOSIS — Z6830 Body mass index (BMI) 30.0-30.9, adult: Secondary | ICD-10-CM | POA: Diagnosis not present

## 2021-05-14 DIAGNOSIS — M4316 Spondylolisthesis, lumbar region: Secondary | ICD-10-CM | POA: Diagnosis not present

## 2021-05-14 DIAGNOSIS — M5416 Radiculopathy, lumbar region: Secondary | ICD-10-CM | POA: Diagnosis not present

## 2021-05-14 DIAGNOSIS — Z13828 Encounter for screening for other musculoskeletal disorder: Secondary | ICD-10-CM | POA: Diagnosis not present

## 2021-05-15 DIAGNOSIS — L720 Epidermal cyst: Secondary | ICD-10-CM | POA: Diagnosis not present

## 2021-06-03 DIAGNOSIS — M48062 Spinal stenosis, lumbar region with neurogenic claudication: Secondary | ICD-10-CM | POA: Diagnosis not present

## 2021-07-03 DIAGNOSIS — I10 Essential (primary) hypertension: Secondary | ICD-10-CM | POA: Diagnosis not present

## 2021-07-03 DIAGNOSIS — M5416 Radiculopathy, lumbar region: Secondary | ICD-10-CM | POA: Diagnosis not present

## 2021-07-03 DIAGNOSIS — Z6829 Body mass index (BMI) 29.0-29.9, adult: Secondary | ICD-10-CM | POA: Diagnosis not present

## 2021-08-15 DIAGNOSIS — Z125 Encounter for screening for malignant neoplasm of prostate: Secondary | ICD-10-CM | POA: Diagnosis not present

## 2021-08-15 DIAGNOSIS — Z6830 Body mass index (BMI) 30.0-30.9, adult: Secondary | ICD-10-CM | POA: Diagnosis not present

## 2021-08-15 DIAGNOSIS — Z23 Encounter for immunization: Secondary | ICD-10-CM | POA: Diagnosis not present

## 2021-08-15 DIAGNOSIS — Z79899 Other long term (current) drug therapy: Secondary | ICD-10-CM | POA: Diagnosis not present

## 2021-08-15 DIAGNOSIS — E7800 Pure hypercholesterolemia, unspecified: Secondary | ICD-10-CM | POA: Diagnosis not present

## 2021-08-15 DIAGNOSIS — Z0000 Encounter for general adult medical examination without abnormal findings: Secondary | ICD-10-CM | POA: Diagnosis not present

## 2021-08-15 DIAGNOSIS — N529 Male erectile dysfunction, unspecified: Secondary | ICD-10-CM | POA: Diagnosis not present

## 2021-08-15 DIAGNOSIS — I10 Essential (primary) hypertension: Secondary | ICD-10-CM | POA: Diagnosis not present

## 2021-10-20 DIAGNOSIS — M48062 Spinal stenosis, lumbar region with neurogenic claudication: Secondary | ICD-10-CM | POA: Diagnosis not present

## 2021-11-10 DIAGNOSIS — M48062 Spinal stenosis, lumbar region with neurogenic claudication: Secondary | ICD-10-CM | POA: Diagnosis not present

## 2021-11-10 DIAGNOSIS — M5416 Radiculopathy, lumbar region: Secondary | ICD-10-CM | POA: Diagnosis not present

## 2021-11-10 DIAGNOSIS — M545 Low back pain, unspecified: Secondary | ICD-10-CM | POA: Diagnosis not present

## 2021-11-10 DIAGNOSIS — M5136 Other intervertebral disc degeneration, lumbar region: Secondary | ICD-10-CM | POA: Diagnosis not present

## 2021-11-14 DIAGNOSIS — Z20822 Contact with and (suspected) exposure to covid-19: Secondary | ICD-10-CM | POA: Diagnosis not present

## 2022-01-01 DIAGNOSIS — I10 Essential (primary) hypertension: Secondary | ICD-10-CM | POA: Diagnosis not present

## 2022-01-01 DIAGNOSIS — Z9181 History of falling: Secondary | ICD-10-CM | POA: Diagnosis not present

## 2022-01-01 DIAGNOSIS — D510 Vitamin B12 deficiency anemia due to intrinsic factor deficiency: Secondary | ICD-10-CM | POA: Diagnosis not present

## 2022-01-01 DIAGNOSIS — Z1331 Encounter for screening for depression: Secondary | ICD-10-CM | POA: Diagnosis not present

## 2022-04-16 DIAGNOSIS — X32XXXS Exposure to sunlight, sequela: Secondary | ICD-10-CM | POA: Diagnosis not present

## 2022-04-16 DIAGNOSIS — L570 Actinic keratosis: Secondary | ICD-10-CM | POA: Diagnosis not present

## 2022-04-16 DIAGNOSIS — L821 Other seborrheic keratosis: Secondary | ICD-10-CM | POA: Diagnosis not present

## 2022-04-16 DIAGNOSIS — L814 Other melanin hyperpigmentation: Secondary | ICD-10-CM | POA: Diagnosis not present

## 2022-04-16 DIAGNOSIS — L578 Other skin changes due to chronic exposure to nonionizing radiation: Secondary | ICD-10-CM | POA: Diagnosis not present

## 2022-05-28 DIAGNOSIS — K649 Unspecified hemorrhoids: Secondary | ICD-10-CM | POA: Diagnosis not present

## 2022-05-28 DIAGNOSIS — K219 Gastro-esophageal reflux disease without esophagitis: Secondary | ICD-10-CM | POA: Diagnosis not present

## 2022-05-28 DIAGNOSIS — K5790 Diverticulosis of intestine, part unspecified, without perforation or abscess without bleeding: Secondary | ICD-10-CM | POA: Diagnosis not present

## 2022-05-28 DIAGNOSIS — R195 Other fecal abnormalities: Secondary | ICD-10-CM | POA: Diagnosis not present

## 2022-07-17 DIAGNOSIS — Z1211 Encounter for screening for malignant neoplasm of colon: Secondary | ICD-10-CM | POA: Diagnosis not present

## 2022-07-17 DIAGNOSIS — Z09 Encounter for follow-up examination after completed treatment for conditions other than malignant neoplasm: Secondary | ICD-10-CM | POA: Diagnosis not present

## 2022-07-17 DIAGNOSIS — K5730 Diverticulosis of large intestine without perforation or abscess without bleeding: Secondary | ICD-10-CM | POA: Diagnosis not present

## 2022-07-17 DIAGNOSIS — K648 Other hemorrhoids: Secondary | ICD-10-CM | POA: Diagnosis not present

## 2022-07-17 DIAGNOSIS — Z86010 Personal history of colonic polyps: Secondary | ICD-10-CM | POA: Diagnosis not present

## 2022-08-06 DIAGNOSIS — Z6831 Body mass index (BMI) 31.0-31.9, adult: Secondary | ICD-10-CM | POA: Diagnosis not present

## 2022-08-06 DIAGNOSIS — M48061 Spinal stenosis, lumbar region without neurogenic claudication: Secondary | ICD-10-CM | POA: Diagnosis not present

## 2022-08-06 DIAGNOSIS — M5440 Lumbago with sciatica, unspecified side: Secondary | ICD-10-CM | POA: Diagnosis not present

## 2022-08-20 DIAGNOSIS — M5126 Other intervertebral disc displacement, lumbar region: Secondary | ICD-10-CM | POA: Diagnosis not present

## 2022-08-20 DIAGNOSIS — M48061 Spinal stenosis, lumbar region without neurogenic claudication: Secondary | ICD-10-CM | POA: Diagnosis not present

## 2022-08-20 DIAGNOSIS — M5440 Lumbago with sciatica, unspecified side: Secondary | ICD-10-CM | POA: Diagnosis not present

## 2022-08-28 DIAGNOSIS — Z6832 Body mass index (BMI) 32.0-32.9, adult: Secondary | ICD-10-CM | POA: Diagnosis not present

## 2022-08-28 DIAGNOSIS — M48062 Spinal stenosis, lumbar region with neurogenic claudication: Secondary | ICD-10-CM | POA: Diagnosis not present

## 2022-08-28 DIAGNOSIS — M5440 Lumbago with sciatica, unspecified side: Secondary | ICD-10-CM | POA: Diagnosis not present

## 2022-09-10 DIAGNOSIS — M48062 Spinal stenosis, lumbar region with neurogenic claudication: Secondary | ICD-10-CM | POA: Diagnosis not present

## 2022-11-10 DIAGNOSIS — M48062 Spinal stenosis, lumbar region with neurogenic claudication: Secondary | ICD-10-CM | POA: Diagnosis not present

## 2023-01-07 DIAGNOSIS — M48062 Spinal stenosis, lumbar region with neurogenic claudication: Secondary | ICD-10-CM | POA: Diagnosis not present

## 2023-01-18 DIAGNOSIS — M5416 Radiculopathy, lumbar region: Secondary | ICD-10-CM | POA: Diagnosis not present

## 2023-02-08 DIAGNOSIS — Z961 Presence of intraocular lens: Secondary | ICD-10-CM | POA: Diagnosis not present

## 2023-02-08 DIAGNOSIS — H35373 Puckering of macula, bilateral: Secondary | ICD-10-CM | POA: Diagnosis not present

## 2023-02-11 DIAGNOSIS — M5440 Lumbago with sciatica, unspecified side: Secondary | ICD-10-CM | POA: Diagnosis not present

## 2023-02-15 DIAGNOSIS — M5416 Radiculopathy, lumbar region: Secondary | ICD-10-CM | POA: Diagnosis not present

## 2023-02-18 DIAGNOSIS — Z125 Encounter for screening for malignant neoplasm of prostate: Secondary | ICD-10-CM | POA: Diagnosis not present

## 2023-02-18 DIAGNOSIS — Z13220 Encounter for screening for lipoid disorders: Secondary | ICD-10-CM | POA: Diagnosis not present

## 2023-02-18 DIAGNOSIS — Z136 Encounter for screening for cardiovascular disorders: Secondary | ICD-10-CM | POA: Diagnosis not present

## 2023-02-18 DIAGNOSIS — M159 Polyosteoarthritis, unspecified: Secondary | ICD-10-CM | POA: Diagnosis not present

## 2023-02-18 DIAGNOSIS — I10 Essential (primary) hypertension: Secondary | ICD-10-CM | POA: Diagnosis not present

## 2023-04-20 DIAGNOSIS — L814 Other melanin hyperpigmentation: Secondary | ICD-10-CM | POA: Diagnosis not present

## 2023-04-20 DIAGNOSIS — X32XXXS Exposure to sunlight, sequela: Secondary | ICD-10-CM | POA: Diagnosis not present

## 2023-04-20 DIAGNOSIS — L821 Other seborrheic keratosis: Secondary | ICD-10-CM | POA: Diagnosis not present

## 2023-04-20 DIAGNOSIS — L570 Actinic keratosis: Secondary | ICD-10-CM | POA: Diagnosis not present

## 2023-04-20 DIAGNOSIS — L720 Epidermal cyst: Secondary | ICD-10-CM | POA: Diagnosis not present

## 2023-05-07 DIAGNOSIS — H353131 Nonexudative age-related macular degeneration, bilateral, early dry stage: Secondary | ICD-10-CM | POA: Diagnosis not present

## 2023-05-07 DIAGNOSIS — H40031 Anatomical narrow angle, right eye: Secondary | ICD-10-CM | POA: Diagnosis not present

## 2023-05-07 DIAGNOSIS — H2511 Age-related nuclear cataract, right eye: Secondary | ICD-10-CM | POA: Diagnosis not present

## 2023-05-11 DIAGNOSIS — M5416 Radiculopathy, lumbar region: Secondary | ICD-10-CM | POA: Diagnosis not present

## 2023-05-11 DIAGNOSIS — M48062 Spinal stenosis, lumbar region with neurogenic claudication: Secondary | ICD-10-CM | POA: Diagnosis not present

## 2023-05-25 DIAGNOSIS — H269 Unspecified cataract: Secondary | ICD-10-CM | POA: Diagnosis not present

## 2023-05-25 DIAGNOSIS — H2511 Age-related nuclear cataract, right eye: Secondary | ICD-10-CM | POA: Diagnosis not present

## 2023-05-25 DIAGNOSIS — H2181 Floppy iris syndrome: Secondary | ICD-10-CM | POA: Diagnosis not present

## 2023-05-25 DIAGNOSIS — H25811 Combined forms of age-related cataract, right eye: Secondary | ICD-10-CM | POA: Diagnosis not present

## 2023-05-27 DIAGNOSIS — M1990 Unspecified osteoarthritis, unspecified site: Secondary | ICD-10-CM | POA: Diagnosis not present

## 2023-05-27 DIAGNOSIS — Z23 Encounter for immunization: Secondary | ICD-10-CM | POA: Diagnosis not present

## 2023-05-27 DIAGNOSIS — I10 Essential (primary) hypertension: Secondary | ICD-10-CM | POA: Diagnosis not present

## 2023-08-10 DIAGNOSIS — M48062 Spinal stenosis, lumbar region with neurogenic claudication: Secondary | ICD-10-CM | POA: Diagnosis not present

## 2023-08-26 DIAGNOSIS — M48062 Spinal stenosis, lumbar region with neurogenic claudication: Secondary | ICD-10-CM | POA: Diagnosis not present

## 2024-05-02 ENCOUNTER — Other Ambulatory Visit (HOSPITAL_BASED_OUTPATIENT_CLINIC_OR_DEPARTMENT_OTHER): Payer: Self-pay | Admitting: Family Medicine

## 2024-05-02 DIAGNOSIS — R1084 Generalized abdominal pain: Secondary | ICD-10-CM

## 2024-05-02 DIAGNOSIS — R634 Abnormal weight loss: Secondary | ICD-10-CM

## 2024-05-10 ENCOUNTER — Ambulatory Visit (INDEPENDENT_AMBULATORY_CARE_PROVIDER_SITE_OTHER)
Admission: RE | Admit: 2024-05-10 | Discharge: 2024-05-10 | Disposition: A | Discharge status: Home / Self Care | Source: Ambulatory Visit | Attending: Family Medicine | Admitting: Family Medicine

## 2024-05-10 DIAGNOSIS — R634 Abnormal weight loss: Secondary | ICD-10-CM | POA: Diagnosis not present

## 2024-05-10 DIAGNOSIS — R1084 Generalized abdominal pain: Secondary | ICD-10-CM

## 2024-05-19 ENCOUNTER — Other Ambulatory Visit (HOSPITAL_BASED_OUTPATIENT_CLINIC_OR_DEPARTMENT_OTHER): Payer: Self-pay | Admitting: Family Medicine

## 2024-05-19 DIAGNOSIS — R932 Abnormal findings on diagnostic imaging of liver and biliary tract: Secondary | ICD-10-CM

## 2024-05-26 ENCOUNTER — Ambulatory Visit (INDEPENDENT_AMBULATORY_CARE_PROVIDER_SITE_OTHER)
Admission: RE | Admit: 2024-05-26 | Discharge: 2024-05-26 | Disposition: A | Discharge status: Home / Self Care | Source: Ambulatory Visit | Attending: Family Medicine | Admitting: Family Medicine

## 2024-05-26 DIAGNOSIS — R932 Abnormal findings on diagnostic imaging of liver and biliary tract: Secondary | ICD-10-CM

## 2024-05-31 ENCOUNTER — Other Ambulatory Visit (HOSPITAL_BASED_OUTPATIENT_CLINIC_OR_DEPARTMENT_OTHER): Payer: Self-pay | Admitting: Family Medicine

## 2024-05-31 DIAGNOSIS — R935 Abnormal findings on diagnostic imaging of other abdominal regions, including retroperitoneum: Secondary | ICD-10-CM

## 2024-05-31 DIAGNOSIS — R634 Abnormal weight loss: Secondary | ICD-10-CM

## 2024-06-15 ENCOUNTER — Ambulatory Visit (HOSPITAL_BASED_OUTPATIENT_CLINIC_OR_DEPARTMENT_OTHER)
Admission: RE | Admit: 2024-06-15 | Discharge: 2024-06-15 | Disposition: A | Discharge status: Home / Self Care | Source: Ambulatory Visit | Attending: Family Medicine | Admitting: Family Medicine

## 2024-06-15 DIAGNOSIS — R935 Abnormal findings on diagnostic imaging of other abdominal regions, including retroperitoneum: Secondary | ICD-10-CM

## 2024-06-15 DIAGNOSIS — R634 Abnormal weight loss: Secondary | ICD-10-CM

## 2024-06-15 MED ORDER — GADOBUTROL 1 MMOL/ML IV SOLN
10.0000 mL | Freq: Once | INTRAVENOUS | Status: AC | PRN
  Administered 2024-06-15: 10 mL via INTRAVENOUS
